# Patient Record
Sex: Female | Born: 1945 | Race: White | Hispanic: No | Marital: Married | Smoking: Never smoker
Health system: Southern US, Community
[De-identification: ages and names within clinical notes are randomized; demographics above are authoritative.]

## PROBLEM LIST (undated history)

## (undated) DIAGNOSIS — E785 Hyperlipidemia, unspecified: Secondary | ICD-10-CM

## (undated) DIAGNOSIS — K5792 Diverticulitis of intestine, part unspecified, without perforation or abscess without bleeding: Secondary | ICD-10-CM

## (undated) HISTORY — PX: TUBAL LIGATION: SHX77

## (undated) HISTORY — PX: TONSILLECTOMY: SUR1361

## (undated) HISTORY — PX: ABDOMINAL HYSTERECTOMY: SHX81

## (undated) HISTORY — PX: UVULOPLASTY: SHX6557

## (undated) HISTORY — DX: Diverticulitis of intestine, part unspecified, without perforation or abscess without bleeding: K57.92

## (undated) HISTORY — PX: APPENDECTOMY: SHX54

## (undated) HISTORY — DX: Hyperlipidemia, unspecified: E78.5

## (undated) HISTORY — PX: UPPER GI ENDOSCOPY: SHX6162

---

## 1985-11-14 HISTORY — PX: HYSTEROTOMY: SHX1776

## 2002-11-04 ENCOUNTER — Encounter: Payer: Self-pay | Admitting: Internal Medicine

## 2002-11-04 ENCOUNTER — Ambulatory Visit (HOSPITAL_COMMUNITY): Admission: RE | Admit: 2002-11-04 | Discharge: 2002-11-04 | Payer: Self-pay | Admitting: Internal Medicine

## 2010-01-26 ENCOUNTER — Ambulatory Visit: Payer: Self-pay | Admitting: Family Medicine

## 2012-08-08 LAB — LIPID PANEL
Cholesterol: 248 mg/dL — AB (ref 0–200)
HDL: 47 mg/dL (ref 35–70)
LDL Cholesterol: 148 mg/dL
LDl/HDL Ratio: 3.1
Triglycerides: 263 mg/dL — AB (ref 40–160)

## 2013-07-30 DIAGNOSIS — M461 Sacroiliitis, not elsewhere classified: Secondary | ICD-10-CM | POA: Insufficient documentation

## 2013-11-20 ENCOUNTER — Other Ambulatory Visit: Payer: Self-pay

## 2013-11-20 DIAGNOSIS — Z1231 Encounter for screening mammogram for malignant neoplasm of breast: Secondary | ICD-10-CM

## 2013-12-09 ENCOUNTER — Ambulatory Visit
Admission: RE | Admit: 2013-12-09 | Discharge: 2013-12-09 | Disposition: A | Discharge status: Home / Self Care | Payer: Self-pay | Source: Ambulatory Visit

## 2013-12-09 DIAGNOSIS — Z1231 Encounter for screening mammogram for malignant neoplasm of breast: Secondary | ICD-10-CM

## 2014-07-08 LAB — TSH: TSH: 2.02 u[IU]/mL (ref 0.41–5.90)

## 2014-07-08 LAB — HEPATIC FUNCTION PANEL
ALT: 17 U/L (ref 7–35)
AST: 15 U/L (ref 13–35)
Alkaline Phosphatase: 73 U/L (ref 25–125)
BILIRUBIN, TOTAL: 1 mg/dL

## 2014-07-08 LAB — CBC AND DIFFERENTIAL
HCT: 40 % (ref 36–46)
Hemoglobin: 13.5 g/dL (ref 12.0–16.0)
Platelets: 330 10*3/uL (ref 150–399)
WBC: 5.4 10^3/mL

## 2014-07-08 LAB — BASIC METABOLIC PANEL
BUN: 11 mg/dL (ref 4–21)
Creatinine: 0.7 mg/dL (ref 0.5–1.1)
Glucose: 112 mg/dL
Potassium: 4.2 mmol/L (ref 3.4–5.3)
Sodium: 143 mmol/L (ref 137–147)

## 2015-10-04 ENCOUNTER — Other Ambulatory Visit: Payer: Self-pay | Admitting: Family Medicine

## 2015-10-04 NOTE — Telephone Encounter (Signed)
Please call in clonazepam  

## 2015-10-15 ENCOUNTER — Other Ambulatory Visit: Payer: Self-pay | Admitting: Family Medicine

## 2015-10-16 NOTE — Telephone Encounter (Signed)
Rx called in to pharmacy. 

## 2015-10-16 NOTE — Telephone Encounter (Signed)
Please call in clonazepam  

## 2015-12-31 DIAGNOSIS — Z791 Long term (current) use of non-steroidal anti-inflammatories (NSAID): Secondary | ICD-10-CM | POA: Diagnosis not present

## 2015-12-31 DIAGNOSIS — M159 Polyosteoarthritis, unspecified: Secondary | ICD-10-CM | POA: Diagnosis not present

## 2015-12-31 DIAGNOSIS — M67362 Transient synovitis, left knee: Secondary | ICD-10-CM | POA: Diagnosis not present

## 2016-05-02 DIAGNOSIS — H5210 Myopia, unspecified eye: Secondary | ICD-10-CM | POA: Diagnosis not present

## 2016-05-06 DIAGNOSIS — G2581 Restless legs syndrome: Secondary | ICD-10-CM | POA: Insufficient documentation

## 2016-05-06 DIAGNOSIS — K219 Gastro-esophageal reflux disease without esophagitis: Secondary | ICD-10-CM | POA: Insufficient documentation

## 2016-05-06 DIAGNOSIS — J309 Allergic rhinitis, unspecified: Secondary | ICD-10-CM | POA: Insufficient documentation

## 2016-05-06 DIAGNOSIS — Z8739 Personal history of other diseases of the musculoskeletal system and connective tissue: Secondary | ICD-10-CM | POA: Insufficient documentation

## 2016-05-06 DIAGNOSIS — M1712 Unilateral primary osteoarthritis, left knee: Secondary | ICD-10-CM | POA: Insufficient documentation

## 2016-05-06 DIAGNOSIS — E785 Hyperlipidemia, unspecified: Secondary | ICD-10-CM | POA: Insufficient documentation

## 2016-05-06 DIAGNOSIS — K449 Diaphragmatic hernia without obstruction or gangrene: Secondary | ICD-10-CM | POA: Insufficient documentation

## 2016-05-06 DIAGNOSIS — K5790 Diverticulosis of intestine, part unspecified, without perforation or abscess without bleeding: Secondary | ICD-10-CM | POA: Insufficient documentation

## 2016-05-09 ENCOUNTER — Ambulatory Visit
Admission: RE | Admit: 2016-05-09 | Discharge: 2016-05-09 | Disposition: A | Discharge status: Home / Self Care | Payer: Medicare Other | Source: Ambulatory Visit | Attending: Family Medicine | Admitting: Family Medicine

## 2016-05-09 ENCOUNTER — Encounter: Payer: Self-pay | Admitting: Family Medicine

## 2016-05-09 ENCOUNTER — Ambulatory Visit (INDEPENDENT_AMBULATORY_CARE_PROVIDER_SITE_OTHER): Payer: Medicare Other | Admitting: Family Medicine

## 2016-05-09 VITALS — BP 102/80 | HR 79 | Temp 97.7°F | Resp 16 | Ht 63.0 in | Wt 199.0 lb

## 2016-05-09 DIAGNOSIS — M5432 Sciatica, left side: Secondary | ICD-10-CM | POA: Diagnosis not present

## 2016-05-09 DIAGNOSIS — M5116 Intervertebral disc disorders with radiculopathy, lumbar region: Secondary | ICD-10-CM | POA: Diagnosis not present

## 2016-05-09 DIAGNOSIS — M5136 Other intervertebral disc degeneration, lumbar region: Secondary | ICD-10-CM | POA: Diagnosis not present

## 2016-05-09 MED ORDER — PREDNISONE 10 MG PO TABS: ORAL_TABLET | ORAL | Status: DC

## 2016-05-09 MED ORDER — TRAMADOL HCL 50 MG PO TABS: ORAL_TABLET | ORAL | Status: DC

## 2016-05-09 NOTE — Patient Instructions (Addendum)
X-ray Lumbar spine Prednisone 12 day  Taper Tramadol 50 mg 1-2 tablets every 4 hours as needed Follow-up with Dr. Sherrie MustacheFisher sometime next week.

## 2016-05-09 NOTE — Progress Notes (Signed)
Patient: Chelsea NipperSandra L Tyler Female    DOB: January 27, 1946   70 y.o.   MRN: [id number] Visit Date: 05/09/2016  Today's Provider: Megan Mansichard Gilbert Jr, MD   Chief Complaint  Patient presents with  . Referral  . Hip Pain  . Leg Pain   Subjective:    Hip Pain  The incident occurred more than 1 week ago (over a year ago). There was no injury mechanism. The pain is present in the left leg and left hip. The quality of the pain is described as cramping and stabbing. The pain is at a severity of 5/10. The pain is moderate. The pain has been constant since onset. Associated symptoms include an inability to bear weight. Pertinent negatives include no loss of motion, loss of sensation, muscle weakness, numbness or tingling. She reports no foreign bodies present. The symptoms are aggravated by weight bearing (sitting). She has tried acetaminophen and NSAIDs for the symptoms. The treatment provided no relief.    Left buttock and back of leg pain for over a year. Pain is worse while sitting. Interrupts her sleep. Will stand when pain gets to bad. Has tried tylenol, aleve, Advil and meloxicam with no relief. Patient's rheumatologist recommended she see pcp for leg pain.    Allergies  Allergen Reactions  . Clarithromycin     Other reaction(s): Abdominal pain   Current Meds  Medication Sig  . clonazePAM (KLONOPIN) 0.5 MG tablet TAKE 1/2 TO 1 TABLET BY MOUTH ONCE A NIGHT AT BEDTIME  . meloxicam (MOBIC) 7.5 MG tablet Take by mouth.    Review of Systems  Constitutional: Negative for fever, chills, appetite change and fatigue.  Eyes: Negative.   Respiratory: Negative for chest tightness and shortness of breath.   Cardiovascular: Negative for chest pain and palpitations.  Gastrointestinal: Negative for nausea, vomiting and abdominal pain.  Endocrine: Negative.   Allergic/Immunologic: Negative.   Neurological: Negative for dizziness, tingling, weakness and numbness.       Straight leg positive on  the left. She has little bit of weakness with plantar flexion of the left foot.  Hematological: Negative.   Psychiatric/Behavioral: Negative.     Social History  Substance Use Topics  . Smoking status: Never Smoker   . Smokeless tobacco: Not on file  . Alcohol Use: No   Objective:   BP 102/80 mmHg  Pulse 79  Temp(Src) 97.7 F (36.5 C) (Oral)  Resp 16  Ht 5\' 3"  (1.6 m)  Wt 199 lb (90.266 kg)  BMI 35.26 kg/m2  SpO2 96%  Physical Exam  Constitutional: She is oriented to person, place, and time. She appears well-nourished.  Obese white female in no acute distress.  HENT:  Head: Normocephalic and atraumatic.  Right Ear: External ear normal.  Left Ear: External ear normal.  Nose: Nose normal.  Eyes: Conjunctivae are normal.  Neck: Neck supple.  Cardiovascular: Normal rate, regular rhythm and normal heart sounds.   Pulmonary/Chest: Effort normal and breath sounds normal.  Abdominal: Soft.  Neurological: She is alert and oriented to person, place, and time.  Skin: Skin is warm and dry.  Psychiatric: She has a normal mood and affect. Her behavior is normal. Judgment and thought content normal.        Assessment & Plan:     1. Sciatica of left side  - DG Lumbar Spine Complete; Future - predniSONE (DELTASONE) 10 MG tablet; Take by mouth 6 tablets for 2 days, then 5 tablets for 2 days,  then 4 tablets for 2 days, then 3 tablets for 2 days, then 2 tablets for 2 days, then 1 tablet for 2 days  Dispense: 42 tablet; Refill: 0 - traMADol (ULTRAM) 50 MG tablet; Take by mouth 1-2 tablets every 4 hours as needed  Dispense: 75 tablet; Refill: 1 I think patient will need MRI on next visit. Hopefully steroids will calm things down. Return to clinic 1-2 weeks. 2. Chronic osteoarthritis      Megan Mansichard Gilbert Jr, MD  [facility]

## 2016-05-10 ENCOUNTER — Other Ambulatory Visit: Payer: Self-pay | Admitting: Family Medicine

## 2016-05-18 NOTE — Telephone Encounter (Signed)
Rx called in to pharmacy. 

## 2016-05-18 NOTE — Telephone Encounter (Signed)
Please call in clonazepam  

## 2016-06-02 ENCOUNTER — Ambulatory Visit (INDEPENDENT_AMBULATORY_CARE_PROVIDER_SITE_OTHER): Payer: Medicare Other | Admitting: Family Medicine

## 2016-06-02 ENCOUNTER — Encounter: Payer: Self-pay | Admitting: Family Medicine

## 2016-06-02 VITALS — BP 100/60 | HR 88 | Temp 97.4°F | Resp 16 | Ht 63.0 in | Wt 200.0 lb

## 2016-06-02 DIAGNOSIS — M5432 Sciatica, left side: Secondary | ICD-10-CM | POA: Diagnosis not present

## 2016-06-02 MED ORDER — PREDNISONE 10 MG PO TABS: ORAL_TABLET | ORAL | Status: AC

## 2016-06-02 NOTE — Progress Notes (Signed)
       Patient: Chelsea NipperSandra L Grupp Female    DOB: 11-27-1945   70 y.o.   MRN: [id number] Visit Date: 06/02/2016  Today's Provider: Mila Merryonald Topaz Raglin, MD   Chief Complaint  Patient presents with  . Follow-up  . Sciatica   Subjective:    HPI  Sciatica of left side: From 05/09/2016- seen by Dr. Sullivan LoneGilbert for left back pain radiating into left leg. Was treated with prednisone and tramadol is feels it is about 80% better. Pain had completely resolved after being on prednisone a couple of days, but has started to flare back up since finishing.   Lumbar Spine Complete; showed Athritis and DDD.    Allergies  Allergen Reactions  . Clarithromycin     Other reaction(s): Abdominal pain   Current Meds  Medication Sig  . clonazePAM (KLONOPIN) 0.5 MG tablet TAKE 1/2 TO 1 TABLET BY MOUTH EVERY NIGHT AT BEDTIME  . meloxicam (MOBIC) 7.5 MG tablet Take by mouth.  . traMADol (ULTRAM) 50 MG tablet Take by mouth 1-2 tablets every 4 hours as needed    Review of Systems  Constitutional: Negative for fever, chills, appetite change and fatigue.  Respiratory: Negative for chest tightness and shortness of breath.   Cardiovascular: Negative for chest pain and palpitations.  Gastrointestinal: Negative for nausea, vomiting and abdominal pain.  Neurological: Negative for dizziness and weakness.    Social History  Substance Use Topics  . Smoking status: Never Smoker   . Smokeless tobacco: Not on file  . Alcohol Use: No   Objective:   BP 100/60 mmHg  Pulse 88  Temp(Src) 97.4 F (36.3 C) (Oral)  Resp 16  Ht 5\' 3"  (1.6 m)  Wt 200 lb (90.719 kg)  BMI 35.44 kg/m2  SpO2 96%  Physical Exam  General appearance: alert, well developed, well nourished, cooperative and in no distress Head: Normocephalic, without obvious abnormality, atraumatic Respiratory: Respirations even and unlabored, normal respiratory rate Extremities: Mild tenderness lumbar spine. Tenderness of left buttocks which reproduces pain  radiating into back of left leg.  Neurologic: Mental status: Alert, oriented to person, place, and time, thought content appropriate.     Assessment & Plan:     1. Sciatica of left side Responded well to prednisone, but has since started to flare back up. Start back on slow longer taper and recommended using seat cushion when sitting for long periods. If symptoms return again will consider PT referral.  - predniSONE (DELTASONE) 10 MG tablet; 4 for 3 days, then 3 for 3 days, then 2 for 3 days, then 1 daily  Dispense: 50 tablet; Refill: 0       Mila Merryonald Landon Bassford, MD  [facility]

## 2016-09-01 DIAGNOSIS — Z23 Encounter for immunization: Secondary | ICD-10-CM | POA: Diagnosis not present

## 2016-09-20 ENCOUNTER — Other Ambulatory Visit: Payer: Self-pay | Admitting: Family Medicine

## 2016-09-20 DIAGNOSIS — M5432 Sciatica, left side: Secondary | ICD-10-CM

## 2016-10-04 DIAGNOSIS — Z6835 Body mass index (BMI) 35.0-35.9, adult: Secondary | ICD-10-CM | POA: Diagnosis not present

## 2016-10-04 DIAGNOSIS — Z1231 Encounter for screening mammogram for malignant neoplasm of breast: Secondary | ICD-10-CM | POA: Diagnosis not present

## 2016-10-04 DIAGNOSIS — Z01419 Encounter for gynecological examination (general) (routine) without abnormal findings: Secondary | ICD-10-CM | POA: Diagnosis not present

## 2016-12-06 DIAGNOSIS — M461 Sacroiliitis, not elsewhere classified: Secondary | ICD-10-CM | POA: Diagnosis not present

## 2016-12-06 DIAGNOSIS — M67362 Transient synovitis, left knee: Secondary | ICD-10-CM | POA: Diagnosis not present

## 2016-12-06 DIAGNOSIS — M159 Polyosteoarthritis, unspecified: Secondary | ICD-10-CM | POA: Diagnosis not present

## 2016-12-06 DIAGNOSIS — Z791 Long term (current) use of non-steroidal anti-inflammatories (NSAID): Secondary | ICD-10-CM | POA: Diagnosis not present

## 2016-12-21 ENCOUNTER — Other Ambulatory Visit: Payer: Self-pay | Admitting: Family Medicine

## 2016-12-21 NOTE — Telephone Encounter (Signed)
Please call in clonazepam  

## 2016-12-22 NOTE — Telephone Encounter (Signed)
Called in. Emily Drozdowski, CMA  

## 2017-03-02 DIAGNOSIS — H25013 Cortical age-related cataract, bilateral: Secondary | ICD-10-CM | POA: Diagnosis not present

## 2017-03-07 DIAGNOSIS — M159 Polyosteoarthritis, unspecified: Secondary | ICD-10-CM | POA: Diagnosis not present

## 2017-03-07 DIAGNOSIS — M67312 Transient synovitis, left shoulder: Secondary | ICD-10-CM | POA: Diagnosis not present

## 2017-03-07 DIAGNOSIS — Z79899 Other long term (current) drug therapy: Secondary | ICD-10-CM | POA: Diagnosis not present

## 2017-03-07 DIAGNOSIS — M67362 Transient synovitis, left knee: Secondary | ICD-10-CM | POA: Diagnosis not present

## 2017-03-10 DIAGNOSIS — H02839 Dermatochalasis of unspecified eye, unspecified eyelid: Secondary | ICD-10-CM | POA: Diagnosis not present

## 2017-03-10 DIAGNOSIS — H18413 Arcus senilis, bilateral: Secondary | ICD-10-CM | POA: Diagnosis not present

## 2017-03-10 DIAGNOSIS — H2513 Age-related nuclear cataract, bilateral: Secondary | ICD-10-CM | POA: Diagnosis not present

## 2017-04-13 DIAGNOSIS — H2511 Age-related nuclear cataract, right eye: Secondary | ICD-10-CM | POA: Diagnosis not present

## 2017-05-01 DIAGNOSIS — H2511 Age-related nuclear cataract, right eye: Secondary | ICD-10-CM | POA: Diagnosis not present

## 2017-05-01 DIAGNOSIS — H2512 Age-related nuclear cataract, left eye: Secondary | ICD-10-CM | POA: Diagnosis not present

## 2017-05-02 DIAGNOSIS — H2512 Age-related nuclear cataract, left eye: Secondary | ICD-10-CM | POA: Diagnosis not present

## 2017-05-03 DIAGNOSIS — Z791 Long term (current) use of non-steroidal anti-inflammatories (NSAID): Secondary | ICD-10-CM | POA: Diagnosis not present

## 2017-05-03 DIAGNOSIS — M159 Polyosteoarthritis, unspecified: Secondary | ICD-10-CM | POA: Diagnosis not present

## 2017-05-22 DIAGNOSIS — H2512 Age-related nuclear cataract, left eye: Secondary | ICD-10-CM | POA: Diagnosis not present

## 2017-06-05 ENCOUNTER — Other Ambulatory Visit: Payer: Self-pay | Admitting: Family Medicine

## 2017-06-06 NOTE — Telephone Encounter (Signed)
RX called in at [facility]. Left message for patient to call back to schedule a follow up appointment.

## 2017-06-06 NOTE — Telephone Encounter (Signed)
OK to call in 30 clonazepam with no refills, but patient has not been seen In over a year and needs to schedule follow up appointment with me or with Dr. Leonard SchwartzB.

## 2017-07-25 ENCOUNTER — Encounter: Payer: Self-pay | Admitting: Family Medicine

## 2017-07-25 ENCOUNTER — Ambulatory Visit (INDEPENDENT_AMBULATORY_CARE_PROVIDER_SITE_OTHER): Payer: Medicare Other | Admitting: Family Medicine

## 2017-07-25 ENCOUNTER — Ambulatory Visit: Payer: Medicare Other | Admitting: Family Medicine

## 2017-07-25 VITALS — BP 120/80 | HR 80 | Temp 98.3°F | Resp 16 | Ht 63.0 in | Wt 212.0 lb

## 2017-07-25 DIAGNOSIS — Z1211 Encounter for screening for malignant neoplasm of colon: Secondary | ICD-10-CM

## 2017-07-25 DIAGNOSIS — Z23 Encounter for immunization: Secondary | ICD-10-CM

## 2017-07-25 DIAGNOSIS — E785 Hyperlipidemia, unspecified: Secondary | ICD-10-CM | POA: Diagnosis not present

## 2017-07-25 DIAGNOSIS — G2581 Restless legs syndrome: Secondary | ICD-10-CM | POA: Diagnosis not present

## 2017-07-25 DIAGNOSIS — R609 Edema, unspecified: Secondary | ICD-10-CM

## 2017-07-25 DIAGNOSIS — Z6837 Body mass index (BMI) 37.0-37.9, adult: Secondary | ICD-10-CM

## 2017-07-25 MED ORDER — CLONAZEPAM 0.5 MG PO TABS: ORAL_TABLET | ORAL | 5 refills | Status: DC

## 2017-07-25 NOTE — Progress Notes (Signed)
       Patient: Chelsea NipperSandra L Tyler Female    DOB: 19-Nov-1945   71 y.o.   MRN: [id number] Visit Date: 07/25/2017  Today's Provider: Mila Merryonald Fisher, MD   Chief Complaint  Patient presents with  . Follow-up   Subjective:    HPI Follow up of Restless leg:  Patient was last seen for this problem 3 years ago. Management during that visit includes starting Clonazepam. Patient reports good complaince with treatment, good tolerance and good symptom control. Patient states that she ran out of her medication 4 days ago and has been having trouble sleeping.     Allergies  Allergen Reactions  . Clarithromycin     Other reaction(s): Abdominal pain     Current Outpatient Prescriptions:  .  clonazePAM (KLONOPIN) 0.5 MG tablet, TAKE 1/2 TO 1 TABLET BY MOUTH EVERY NIGHT AT BEDTIME, Disp: 30 tablet, Rfl: 0 .  meloxicam (MOBIC) 7.5 MG tablet, Take by mouth., Disp: , Rfl:  .  traMADol (ULTRAM) 50 MG tablet, Take by mouth 1-2 tablets every 4 hours as needed, Disp: 75 tablet, Rfl: 1  Review of Systems  Constitutional: Negative for appetite change, chills, fatigue and fever.  Respiratory: Negative for chest tightness and shortness of breath.   Cardiovascular: Positive for leg swelling (swelling in both ankles). Negative for chest pain and palpitations.  Gastrointestinal: Negative for abdominal pain, nausea and vomiting.  Neurological: Negative for dizziness and weakness.    Social History  Substance Use Topics  . Smoking status: Never Smoker  . Smokeless tobacco: Never Used  . Alcohol use No   Objective:   BP 120/80 (BP Location: Left Arm, Patient Position: Sitting, Cuff Size: Large)   Pulse 80   Temp 98.3 F (36.8 C) (Oral)   Resp 16   Ht 5\' 3"  (1.6 m)   Wt 212 lb (96.2 kg)   SpO2 97% Comment: room air  BMI 37.55 kg/m     Physical Exam   General Appearance:    Alert, cooperative, no distress  Eyes:    PERRL, conjunctiva/corneas clear, EOM's intact       Lungs:     Clear to  auscultation bilaterally, respirations unlabored  Heart:    Regular rate and rhythm  Neurologic:   Awake, alert, oriented x 3. No apparent focal neurological           defect.           Assessment & Plan:     1. Restless leg Well controlled with clonazepam qhs - clonazePAM (KLONOPIN) 0.5 MG tablet; TAKE 1/2 TO 1 TABLET BY MOUTH EVERY NIGHT AT BEDTIME  Dispense: 30 tablet; Refill: 5  2. Hyperlipidemia, unspecified hyperlipidemia type diet controlled.  - Lipid panel  3. Edema, unspecified type Counseled to avoid high sodium foods.  - Comprehensive metabolic panel - CBC  4. Need for pneumococcal vaccination - Pneumococcal conjugate vaccine 13-valent IM  5. Need for influenza vaccination - Flu vaccine HIGH DOSE PF (Fluzone High dose)  6. BMI 37.0-37.9, adult Counseled healthy diet.   . Colon cancer screening  - Cologuard       Mila Merryonald Fisher, MD  [facility]

## 2017-07-26 ENCOUNTER — Telehealth: Payer: Self-pay | Admitting: Family Medicine

## 2017-07-26 NOTE — Telephone Encounter (Signed)
Order for cologuard faxed to [facility] °

## 2017-08-08 DIAGNOSIS — Z1212 Encounter for screening for malignant neoplasm of rectum: Secondary | ICD-10-CM | POA: Diagnosis not present

## 2017-08-08 DIAGNOSIS — E785 Hyperlipidemia, unspecified: Secondary | ICD-10-CM | POA: Diagnosis not present

## 2017-08-08 DIAGNOSIS — Z1211 Encounter for screening for malignant neoplasm of colon: Secondary | ICD-10-CM | POA: Diagnosis not present

## 2017-08-08 DIAGNOSIS — R609 Edema, unspecified: Secondary | ICD-10-CM | POA: Diagnosis not present

## 2017-08-08 LAB — COMPREHENSIVE METABOLIC PANEL
AG RATIO: 1.8 (calc) (ref 1.0–2.5)
ALT: 22 U/L (ref 6–29)
AST: 19 U/L (ref 10–35)
Albumin: 4.1 g/dL (ref 3.6–5.1)
Alkaline phosphatase (APISO): 64 U/L (ref 33–130)
BUN: 9 mg/dL (ref 7–25)
CO2: 28 mmol/L (ref 20–32)
Calcium: 9.1 mg/dL (ref 8.6–10.4)
Chloride: 105 mmol/L (ref 98–110)
Creat: 0.66 mg/dL (ref 0.50–0.99)
GLOBULIN: 2.3 g/dL (ref 1.9–3.7)
GLUCOSE: 107 mg/dL — AB (ref 65–99)
Potassium: 4.7 mmol/L (ref 3.5–5.3)
SODIUM: 140 mmol/L (ref 135–146)
TOTAL PROTEIN: 6.4 g/dL (ref 6.1–8.1)
Total Bilirubin: 1.4 mg/dL — ABNORMAL HIGH (ref 0.2–1.2)

## 2017-08-08 LAB — CBC
HEMATOCRIT: 40 % (ref 35.0–45.0)
Hemoglobin: 13.7 g/dL (ref 11.7–15.5)
MCH: 29.5 pg (ref 27.0–33.0)
MCHC: 34.3 g/dL (ref 32.0–36.0)
MCV: 86 fL (ref 80.0–100.0)
MPV: 10.9 fL (ref 7.5–12.5)
PLATELETS: 347 10*3/uL (ref 140–400)
RBC: 4.65 10*6/uL (ref 3.80–5.10)
RDW: 13.8 % (ref 11.0–15.0)
WBC: 5.6 10*3/uL (ref 3.8–10.8)

## 2017-08-08 LAB — LIPID PANEL
Cholesterol: 236 mg/dL — ABNORMAL HIGH (ref <200)
HDL: 52 mg/dL (ref >50)
LDL Cholesterol (Calc): 151 mg/dL (calc) — ABNORMAL HIGH
Non-HDL Cholesterol (Calc): 184 mg/dL (calc) — ABNORMAL HIGH (ref <130)
Total CHOL/HDL Ratio: 4.5 (calc) (ref <5.0)
Triglycerides: 190 mg/dL — ABNORMAL HIGH (ref <150)

## 2017-08-09 ENCOUNTER — Telehealth: Payer: Self-pay

## 2017-08-09 LAB — COLOGUARD: COLOGUARD: POSITIVE

## 2017-08-09 NOTE — Telephone Encounter (Signed)
Patient advised.KW 

## 2017-08-09 NOTE — Telephone Encounter (Signed)
-----   Message from Malva Limes, MD sent at 08/09/2017  7:39 AM EDT ----- Cholesterol a little better, down from 248 to 236. Rest of labs normal. Avoid saturated fats in diet and check lipids one year.

## 2017-08-15 ENCOUNTER — Telehealth: Payer: Self-pay | Admitting: *Deleted

## 2017-08-15 DIAGNOSIS — R195 Other fecal abnormalities: Secondary | ICD-10-CM

## 2017-08-15 NOTE — Telephone Encounter (Signed)
Patient was notified of results. Patient wants [place] to wait before starting GI referral. She is deciding who she wants to be referred to. She will call us back in a couple of days.

## 2017-08-15 NOTE — Telephone Encounter (Signed)
-----   Message from Malva Limes, MD sent at 08/14/2017 12:23 PM EDT ----- Cologuard test is positive, she needs referral for colonoscopy for further evaluation.

## 2017-08-16 NOTE — Telephone Encounter (Signed)
Patient cb stating that she has decided to go ahead with referral for GI. Patient wants referral with [facility].

## 2017-08-16 NOTE — Telephone Encounter (Signed)
Please schedule referral to [facility]. Thanks!

## 2017-09-05 DIAGNOSIS — Z800 Family history of malignant neoplasm of digestive organs: Secondary | ICD-10-CM | POA: Diagnosis not present

## 2017-09-05 DIAGNOSIS — Z86010 Personal history of colonic polyps: Secondary | ICD-10-CM | POA: Diagnosis not present

## 2017-09-05 DIAGNOSIS — K219 Gastro-esophageal reflux disease without esophagitis: Secondary | ICD-10-CM | POA: Diagnosis not present

## 2017-09-05 DIAGNOSIS — R195 Other fecal abnormalities: Secondary | ICD-10-CM | POA: Diagnosis not present

## 2017-09-14 DIAGNOSIS — K222 Esophageal obstruction: Secondary | ICD-10-CM | POA: Diagnosis not present

## 2017-09-14 DIAGNOSIS — R195 Other fecal abnormalities: Secondary | ICD-10-CM | POA: Diagnosis not present

## 2017-09-14 DIAGNOSIS — K210 Gastro-esophageal reflux disease with esophagitis: Secondary | ICD-10-CM | POA: Diagnosis not present

## 2017-09-14 DIAGNOSIS — K5730 Diverticulosis of large intestine without perforation or abscess without bleeding: Secondary | ICD-10-CM | POA: Diagnosis not present

## 2017-09-14 DIAGNOSIS — K648 Other hemorrhoids: Secondary | ICD-10-CM | POA: Diagnosis not present

## 2017-09-14 DIAGNOSIS — Z86010 Personal history of colonic polyps: Secondary | ICD-10-CM | POA: Diagnosis not present

## 2017-09-14 LAB — HM COLONOSCOPY

## 2018-01-23 ENCOUNTER — Encounter: Payer: Self-pay | Admitting: Family Medicine

## 2018-01-23 ENCOUNTER — Ambulatory Visit (INDEPENDENT_AMBULATORY_CARE_PROVIDER_SITE_OTHER): Payer: Medicare Other | Admitting: Family Medicine

## 2018-01-23 VITALS — BP 122/84 | HR 77 | Temp 97.5°F | Resp 16 | Ht 63.0 in | Wt 206.0 lb

## 2018-01-23 DIAGNOSIS — R7303 Prediabetes: Secondary | ICD-10-CM | POA: Insufficient documentation

## 2018-01-23 DIAGNOSIS — Z780 Asymptomatic menopausal state: Secondary | ICD-10-CM

## 2018-01-23 DIAGNOSIS — Z1159 Encounter for screening for other viral diseases: Secondary | ICD-10-CM | POA: Diagnosis not present

## 2018-01-23 DIAGNOSIS — R739 Hyperglycemia, unspecified: Secondary | ICD-10-CM | POA: Diagnosis not present

## 2018-01-23 DIAGNOSIS — Z0000 Encounter for general adult medical examination without abnormal findings: Secondary | ICD-10-CM

## 2018-01-23 DIAGNOSIS — Z1239 Encounter for other screening for malignant neoplasm of breast: Secondary | ICD-10-CM

## 2018-01-23 DIAGNOSIS — Z1231 Encounter for screening mammogram for malignant neoplasm of breast: Secondary | ICD-10-CM | POA: Diagnosis not present

## 2018-01-23 NOTE — Progress Notes (Deleted)
Patient: Chelsea Tyler, Female    DOB: 03/14/1946, 72 y.o.   MRN: [id number] Visit Date: 01/23/2018  Today's Provider: Shirlee LatchAngela Bacigalupo, MD   I, Joslyn HyEmily Aira Sallade, CMA, am acting as scribe for Shirlee LatchAngela Bacigalupo, MD.  No chief complaint on file.  Subjective:    Annual physical exam Chelsea NipperSandra L Kunkle is a 72 y.o. female who presents today for health maintenance and complete physical. She feels {DESC; WELL/FAIRLY WELL/POORLY:18703}. She reports exercising ***. She reports she is sleeping {DESC; WELL/FAIRLY WELL/POORLY:18703}.  Last mammogram- 12/09/2013- BI-RADS 1 -----------------------------------------------------------------   Review of Systems  Social History      She  reports that  has never smoked. she has never used smokeless tobacco. She reports that she does not drink alcohol or use drugs.       Social History   Socioeconomic History  . Marital status: Married    Spouse name: Not on file  . Number of children: Not on file  . Years of education: Not on file  . Highest education level: Not on file  Social Needs  . Financial resource strain: Not on file  . Food insecurity - worry: Not on file  . Food insecurity - inability: Not on file  . Transportation needs - medical: Not on file  . Transportation needs - non-medical: Not on file  Occupational History  . Not on file  Tobacco Use  . Smoking status: Never Smoker  . Smokeless tobacco: Never Used  Substance and Sexual Activity  . Alcohol use: No    Alcohol/week: 0.0 oz  . Drug use: No  . Sexual activity: Not on file  Other Topics Concern  . Not on file  Social History Narrative  . Not on file    No past medical history on file.   Patient Active Problem List   Diagnosis Date Noted  . Allergic rhinitis 05/06/2016  . DD (diverticular disease) 05/06/2016  . Acid reflux 05/06/2016  . 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 Personal history of other diseases of the musculoskeletal system and connective  tissue 05/06/2016  . HLD (hyperlipidemia) 05/06/2016  . Arthritis, degenerative 05/06/2016  . Restless leg 05/06/2016    Past Surgical History:  Procedure Laterality Date  . ABDOMINAL HYSTERECTOMY    . APPENDECTOMY    . HYSTEROTOMY  1987  . TONSILLECTOMY    . TUBAL LIGATION    . UPPER GI ENDOSCOPY     10/15/02  . UVULOPLASTY      Family History        Family Status  Relation Name Status  . Father  Deceased at age 70  . Daughter  Alive  . Son  Deceased       MVA  . Daughter  Alive  . Mother  (Not Specified)        Her family history includes Arthritis in her mother; Breast cancer in her mother; Cancer in her father; Heart disease in her mother.      Allergies  Allergen Reactions  . Clarithromycin     Other reaction(s): Abdominal pain     Current Outpatient Medications:  .  clonazePAM (KLONOPIN) 0.5 MG tablet, TAKE 1/2 TO 1 TABLET BY MOUTH EVERY NIGHT AT BEDTIME, Disp: 30 tablet, Rfl: 5 .  meloxicam (MOBIC) 7.5 MG tablet, Take by mouth., Disp: , Rfl:  .  traMADol (ULTRAM) 50 MG tablet, Take by mouth 1-2 tablets every 4 hours as needed, Disp: 75 tablet, Rfl: 1   Patient Care  Team: Malva Limes, MD as PCP - General (Family Medicine)      Objective:   Vitals: There were no vitals taken for this visit.  There were no vitals filed for this visit.   Physical Exam   Depression Screen PHQ 2/9 Scores 07/25/2017  PHQ - 2 Score 0  PHQ- 9 Score 0      Assessment & Plan:     Routine Health Maintenance and Physical Exam  Exercise Activities and Dietary recommendations Goals    None      Immunization History  Administered Date(s) Administered  . Influenza, High Dose Seasonal PF 07/25/2017  . Pneumococcal Conjugate-13 07/25/2017  . Pneumococcal Polysaccharide-23 08/22/2013  . Td 12/17/1999  . Zoster 08/06/2012    Health Maintenance  Topic Date Due  . Hepatitis C Screening  [birth year]  . COLONOSCOPY  02/08/1998  . TETANUS/TDAP  12/16/2009  .  DEXA SCAN  02/08/2013  . MAMMOGRAM  12/10/2015  . INFLUENZA VACCINE  Completed  . PNA vac Low Risk Adult  Completed     Discussed health benefits of physical activity, and encouraged her to engage in regular exercise appropriate for her age and condition.    --------------------------------------------------------------------

## 2018-01-23 NOTE — Progress Notes (Signed)
Patient: Chelsea Tyler, Female    DOB: 12/25/1945, 72 y.o.   MRN: [id number] Visit Date: 01/23/2018  Today's Provider: Shirlee LatchAngela Boen Sterbenz, MD   Chief Complaint  Patient presents with  . Medicare Wellness   Subjective:    Annual wellness visit Chelsea NipperSandra L Tyler is a 72 y.o. female. She feels well. She is c/o bilateral leg swelling. She reports exercising none due to arthritic pain. She reports she is sleeping poorly.  Last mammogram- 12/09/2013- BI-RADS 1 in our records; pt states [facility]. Last colonoscopy- 09/14/2017- at [facility] through Dr. Mechele CollinElliott. Will request results. Pt states this was WNL. -----------------------------------------------------------   Review of Systems  Constitutional: Negative.   HENT: Negative.   Eyes: Negative.   Respiratory: Negative.   Cardiovascular: Positive for leg swelling. Negative for chest pain and palpitations.  Gastrointestinal: Positive for constipation. Negative for abdominal distention, abdominal pain, anal bleeding, blood in stool, diarrhea, nausea, rectal pain and vomiting.  Endocrine: Negative.   Genitourinary: Negative.   Musculoskeletal: Positive for arthralgias and joint swelling. Negative for back pain, gait problem, myalgias, neck pain and neck stiffness.  Skin: Negative.   Allergic/Immunologic: Negative.   Neurological: Negative.   Hematological: Negative.   Psychiatric/Behavioral: Negative.     Social History   Socioeconomic History  . Marital status: Married    Spouse name: Chelsea Tyler  . Number of children: 3  . Years of education: Not on file  . Highest education level: GED or equivalent  Social Needs  . Financial resource strain: Not hard at all  . Food insecurity - worry: Never true  . Food insecurity - inability: Never true  . Transportation needs - medical: No  . Transportation needs - non-medical: No  Occupational History  . Occupation: retired  Tobacco  Use  . Smoking status: Never Smoker  . Smokeless tobacco: Never Used  Substance and Sexual Activity  . Alcohol use: No    Alcohol/week: 0.0 oz  . Drug use: No  . Sexual activity: Not on file  Other Topics Concern  . Not on file  Social History Narrative   Pt's son was killed by a drunk driver.    History reviewed. No pertinent past medical history.   Patient Active Problem List   Diagnosis Date Noted  . Hyperglycemia 01/23/2018  . Allergic rhinitis 05/06/2016  . DD (diverticular disease) 05/06/2016  . Acid reflux 05/06/2016  . 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 Personal history of other diseases of the musculoskeletal system and connective tissue 05/06/2016  . HLD (hyperlipidemia) 05/06/2016  . Arthritis, degenerative 05/06/2016  . Restless leg 05/06/2016    Past Surgical History:  Procedure Laterality Date  . ABDOMINAL HYSTERECTOMY    . APPENDECTOMY    . HYSTEROTOMY  1987  . TONSILLECTOMY    . TUBAL LIGATION    . UPPER GI ENDOSCOPY     10/15/02  . UVULOPLASTY      Her family history includes Arthritis in her mother; Breast cancer in her mother; Healthy in her daughter, daughter, sister, and sister; Heart disease in her mother; Ovarian cancer in her mother; Stomach cancer in her father.      Current Outpatient Medications:  .  clonazePAM (KLONOPIN) 0.5 MG tablet, TAKE 1/2 TO 1 TABLET BY MOUTH EVERY NIGHT AT BEDTIME, Disp: 30 tablet, Rfl: 5 .  meloxicam (MOBIC) 7.5 MG tablet, Take by mouth., Disp: , Rfl:  .  ranitidine (ZANTAC) 150 MG  tablet, Take 150 mg by mouth 2 (two) times daily., Disp: , Rfl:  .  traMADol (ULTRAM) 50 MG tablet, Take by mouth 1-2 tablets every 4 hours as needed, Disp: 75 tablet, Rfl: 1  Patient Care Team: Malva Limes, MD as PCP - General (Family Medicine)     Objective:   Vitals: BP 122/84 (BP Location: Left Arm, Patient Position: Sitting, Cuff Size: Large)   Pulse 77   Temp (!) 97.5 F (36.4 C) (Oral)   Resp 16   Ht 5\' 3"  (1.6 m)    Wt 206 lb (93.4 kg)   SpO2 97%   BMI 36.49 kg/m   Physical Exam  Constitutional: She is oriented to person, place, and time. She appears well-developed and well-nourished. No distress.  HENT:  Head: Normocephalic and atraumatic.  Right Ear: External ear normal.  Left Ear: External ear normal.  Nose: Nose normal.  Mouth/Throat: Oropharynx is clear and moist.  Eyes: Conjunctivae and EOM are normal. Pupils are equal, round, and reactive to light. No scleral icterus.  Neck: Neck supple. No thyromegaly present.  Cardiovascular: Normal rate, regular rhythm, normal heart sounds and intact distal pulses.  No murmur heard. Pulmonary/Chest: Effort normal and breath sounds normal. No respiratory distress. She has no wheezes. She has no rales.  Abdominal: Soft. Bowel sounds are normal. She exhibits no distension. There is no tenderness. There is no rebound and no guarding.  Genitourinary:  Genitourinary Comments: Breasts: breasts appear normal, no suspicious masses, no skin or nipple changes or axillary nodes.  Musculoskeletal: She exhibits no edema or deformity.  Lymphadenopathy:    She has no cervical adenopathy.  Neurological: She is alert and oriented to person, place, and time.  Skin: Skin is warm and dry. No rash noted.  Psychiatric: She has a normal mood and affect. Her behavior is normal.  Vitals reviewed.   Activities of Daily Living In your present state of health, do you have any difficulty performing the following activities: 01/23/2018  Hearing? N  Vision? N  Difficulty concentrating or making decisions? N  Walking or climbing stairs? Y  Dressing or bathing? N  Doing errands, shopping? N  Some recent data might be hidden    Fall Risk Assessment Fall Risk  01/23/2018 07/25/2017  Falls in the past year? No No     Depression Screen PHQ 2/9 Scores 01/23/2018 07/25/2017  PHQ - 2 Score 0 0  PHQ- 9 Score - 0    Cognitive Testing - 6-CIT  Correct? Score   What year is it?  yes 0 0 or 4  What month is it? yes 0 0 or 3  Memorize:    Chelsea Tyler,  44,  High [address]  [place],      What time is it? (within 1 hour) yes 0 0 or 3  Count backwards from 20 yes 0 0, 2, or 4  Name the months of the year yes 0 0, 2, or 4  Repeat name & address above yes 0 0, 2, 4, 6, 8, or 10       TOTAL SCORE  0/28   Interpretation:  Normal  Normal (0-7) Abnormal (8-28)    Audit-C Alcohol Use Screening  Question Answer Points  How often do you have alcoholic drink? never 0  On days you do drink alcohol, how many drinks do you typically consume? 0 0  How oftey will you drink 6 or more in a total? never 0  Total Score:  0  A score of 3 or more in women, and 4 or more in men indicates increased risk for alcohol abuse, EXCEPT if all of the points are from question 1.     Assessment & Plan:     Annual Wellness Visit  Reviewed patient's Family Medical History Reviewed and updated list of patient's medical providers Assessment of cognitive impairment was done Assessed patient's functional ability Established a written schedule for health screening services Health Risk Assessent Completed and Reviewed  Exercise Activities and Dietary recommendations Goals    None      Immunization History  Administered Date(s) Administered  . Influenza, High Dose Seasonal PF 07/25/2017  . Pneumococcal Conjugate-13 07/25/2017  . Pneumococcal Polysaccharide-23 08/22/2013  . Td 12/17/1999  . Zoster 08/06/2012    Health Maintenance  Topic Date Due  . Hepatitis C Screening  24-Apr-1946  . COLONOSCOPY  02/08/1998  . TETANUS/TDAP  12/16/2009  . DEXA SCAN  02/08/2013  . MAMMOGRAM  12/10/2015  . INFLUENZA VACCINE  Completed  . PNA vac Low Risk Adult  Completed     Discussed health benefits of physical activity, and encouraged her to engage in regular exercise appropriate for her age and condition.      ------------------------------------------------------------------------------------------------------------ Problem List Items Addressed This Visit      Other   Hyperglycemia   Relevant Orders   Hemoglobin A1c    Other Visit Diagnoses    Medicare annual wellness visit, subsequent    -  Primary   Need for hepatitis C screening test       Relevant Orders   Hepatitis C Antibody   Postmenopausal       Relevant Orders   DG Bone Density   Screening for breast cancer       Relevant Orders   MM SCREENING BREAST TOMO BILATERAL       Return in about 6 months (around 07/26/2018) for PCP f/u chronic disease.   The entirety of the information documented in the History of Present Illness, Review of Systems and Physical Exam were personally obtained by me. Portions of this information were initially documented by Irving Burton Ratchford, CMA and reviewed by me for thoroughness and accuracy.    Erasmo Downer, MD, MPH [facility] 01/23/2018 10:52 AM

## 2018-01-23 NOTE — Patient Instructions (Signed)
Preventive Care 65 Years and Older, Female Preventive care refers to lifestyle choices and visits with your health care provider that can promote health and wellness. What does preventive care include?  A yearly physical exam. This is also called an annual well check.  Dental exams once or twice a year.  Routine eye exams. Ask your health care provider how often you should have your eyes checked.  Personal lifestyle choices, including: ? Daily care of your teeth and gums. ? Regular physical activity. ? Eating a healthy diet. ? Avoiding tobacco and drug use. ? Limiting alcohol use. ? Practicing safe sex. ? Taking low-dose aspirin every day. ? Taking vitamin and mineral supplements as recommended by your health care provider. What happens during an annual well check? The services and screenings done by your health care provider during your annual well check will depend on your age, overall health, lifestyle risk factors, and family history of disease. Counseling Your health care provider may ask you questions about your:  Alcohol use.  Tobacco use.  Drug use.  Emotional well-being.  Home and relationship well-being.  Sexual activity.  Eating habits.  History of falls.  Memory and ability to understand (cognition).  Work and work environment.  Reproductive health.  Screening You may have the following tests or measurements:  Height, weight, and BMI.  Blood pressure.  Lipid and cholesterol levels. These may be checked every 5 years, or more frequently if you are over 50 years old.  Skin check.  Lung cancer screening. You may have this screening every year starting at age 55 if you have a 30-pack-year history of smoking and currently smoke or have quit within the past 15 years.  Fecal occult blood test (FOBT) of the stool. You may have this test every year starting at age 50.  Flexible sigmoidoscopy or colonoscopy. You may have a sigmoidoscopy every 5 years or  a colonoscopy every 10 years starting at age 50.  Hepatitis C blood test.  Hepatitis B blood test.  Sexually transmitted disease (STD) testing.  Diabetes screening. This is done by checking your blood sugar (glucose) after you have not eaten for a while (fasting). You may have this done every 1-3 years.  Bone density scan. This is done to screen for osteoporosis. You may have this done starting at age 70.  Mammogram. This may be done every 1-2 years. Talk to your health care provider about how often you should have regular mammograms.  Talk with your health care provider about your test results, treatment options, and if necessary, the need for more tests. Vaccines Your health care provider may recommend certain vaccines, such as:  Influenza vaccine. This is recommended every year.  Tetanus, diphtheria, and acellular pertussis (Tdap, Td) vaccine. You may need a Td booster every 10 years.  Varicella vaccine. You may need this if you have not been vaccinated.  Zoster vaccine. You may need this after age 60.  Measles, mumps, and rubella (MMR) vaccine. You may need at least one dose of MMR if you were born in 1957 or later. You may also need a second dose.  Pneumococcal 13-valent conjugate (PCV13) vaccine. One dose is recommended after age 70.  Pneumococcal polysaccharide (PPSV23) vaccine. One dose is recommended after age 70.  Meningococcal vaccine. You may need this if you have certain conditions.  Hepatitis A vaccine. You may need this if you have certain conditions or if you travel or work in places where you may be exposed to hepatitis   A.  Hepatitis B vaccine. You may need this if you have certain conditions or if you travel or work in places where you may be exposed to hepatitis B.  Haemophilus influenzae type b (Hib) vaccine. You may need this if you have certain conditions.  Talk to your health care provider about which screenings and vaccines you need and how often you  need them. This information is not intended to replace advice given to you by your health care provider. Make sure you discuss any questions you have with your health care provider. Document Released: 11/27/2015 Document Revised: 07/20/2016 Document Reviewed: 09/01/2015 Elsevier Interactive Patient Education  2018 [employer]. Cholesterol Cholesterol is a white, waxy, fat-like substance that is needed by the human body in small amounts. The liver makes all the cholesterol we need. Cholesterol is carried from the liver by the blood through the blood vessels. Deposits of cholesterol (plaques) may build up on blood vessel (artery) walls. Plaques make the arteries narrower and stiffer. Cholesterol plaques increase the risk for heart attack and stroke. You cannot feel your cholesterol level even if it is very high. The only way to know that it is high is to have a blood test. Once you know your cholesterol levels, you should keep a record of the test results. Work with your health care provider to keep your levels in the desired range. What do the results mean?  Total cholesterol is a rough measure of all the cholesterol in your blood.  LDL (low-density lipoprotein) is the "bad" cholesterol. This is the type that causes plaque to build up on the artery walls. You want this level to be low.  HDL (high-density lipoprotein) is the "good" cholesterol because it cleans the arteries and carries the LDL away. You want this level to be high.  Triglycerides are fat that the body can either burn for energy or store. High levels are closely linked to heart disease. What are the desired levels of cholesterol?  Total cholesterol below 200.  LDL below 100 for people who are at risk, below 70 for people at very high risk.  HDL above 40 is good. A level of 60 or higher is considered to be protective against heart disease.  Triglycerides below 150. How can I lower my cholesterol? Diet Follow your diet program  as told by your health care provider.  Choose fish or white meat chicken and [place], roasted or baked. Limit fatty cuts of red meat, fried foods, and processed meats, such as sausage and lunch meats.  Eat lots of fresh fruits and vegetables.  Choose whole grains, beans, pasta, potatoes, and cereals.  Choose olive oil, corn oil, or canola oil, and use only small amounts.  Avoid butter, mayonnaise, shortening, or palm kernel oils.  Avoid foods with trans fats.  Drink skim or nonfat milk and eat low-fat or nonfat yogurt and cheeses. Avoid whole milk, cream, ice cream, egg yolks, and full-fat cheeses.  Healthier desserts include angel food cake, ginger snaps, animal crackers, hard candy, popsicles, and low-fat or nonfat frozen yogurt. Avoid pastries, cakes, pies, and cookies.  Exercise  Follow your exercise program as told by your health care provider. A regular program: ? Helps to decrease LDL and raise HDL. ? Helps with weight control.  Do things that increase your activity level, such as gardening, walking, and taking the stairs.  Ask your health care provider about ways that you can be more active in your daily life.  Medicine  Take over-the-counter  and prescription medicines only as told by your health care provider. ? Medicine may be prescribed by your health care provider to help lower cholesterol and decrease the risk for heart disease. This is usually done if diet and exercise have failed to bring down cholesterol levels. ? If you have several risk factors, you may need medicine even if your levels are normal.  This information is not intended to replace advice given to you by your health care provider. Make sure you discuss any questions you have with your health care provider. Document Released: 07/26/2001 Document Revised: 05/28/2016 Document Reviewed: 04/30/2016 Elsevier Interactive Patient Education  [employer].

## 2018-01-24 ENCOUNTER — Telehealth: Payer: Self-pay

## 2018-01-24 LAB — HEMOGLOBIN A1C
Est. average glucose Bld gHb Est-mCnc: 131 mg/dL
HEMOGLOBIN A1C: 6.2 % — AB (ref 4.8–5.6)

## 2018-01-24 LAB — HEPATITIS C ANTIBODY

## 2018-01-24 NOTE — Telephone Encounter (Signed)
-----   Message from Erasmo DownerAngela M Bacigalupo, MD sent at 01/24/2018  8:48 AM EDT ----- Negative Hep C screen.  A1c shows pre-diabetes.  Recommend low carb diet and regular exercise to control blood sugars and reduce risk of progression to diabetes.  Erasmo DownerBacigalupo, Angela M, MD, MPH [facility] 01/24/2018 8:48 AM

## 2018-01-24 NOTE — Telephone Encounter (Signed)
Patient advised.KW 

## 2018-01-29 ENCOUNTER — Other Ambulatory Visit: Payer: Self-pay | Admitting: Family Medicine

## 2018-01-29 ENCOUNTER — Ambulatory Visit
Admission: RE | Admit: 2018-01-29 | Discharge: 2018-01-29 | Disposition: A | Discharge status: Home / Self Care | Payer: Medicare Other | Source: Ambulatory Visit | Attending: Family Medicine | Admitting: Family Medicine

## 2018-01-29 DIAGNOSIS — Z1231 Encounter for screening mammogram for malignant neoplasm of breast: Secondary | ICD-10-CM | POA: Diagnosis not present

## 2018-01-29 DIAGNOSIS — Z1239 Encounter for other screening for malignant neoplasm of breast: Secondary | ICD-10-CM

## 2018-01-29 DIAGNOSIS — G2581 Restless legs syndrome: Secondary | ICD-10-CM

## 2018-02-05 ENCOUNTER — Inpatient Hospital Stay
Admission: RE | Admit: 2018-02-05 | Discharge: 2018-02-05 | Disposition: A | Discharge status: Home / Self Care | Payer: Self-pay | Source: Ambulatory Visit | Attending: *Deleted | Admitting: *Deleted

## 2018-02-05 ENCOUNTER — Other Ambulatory Visit: Payer: Self-pay | Admitting: *Deleted

## 2018-02-05 DIAGNOSIS — Z9289 Personal history of other medical treatment: Secondary | ICD-10-CM

## 2018-02-06 ENCOUNTER — Telehealth: Payer: Self-pay

## 2018-02-06 NOTE — Telephone Encounter (Signed)
Pt advised.

## 2018-02-06 NOTE — Telephone Encounter (Signed)
-----   Message from Erasmo DownerAngela M Bacigalupo, MD sent at 02/06/2018  8:12 AM EDT ----- Normal mammogram

## 2018-02-14 ENCOUNTER — Ambulatory Visit
Admission: RE | Admit: 2018-02-14 | Discharge: 2018-02-14 | Disposition: A | Discharge status: Home / Self Care | Payer: Medicare Other | Source: Ambulatory Visit | Attending: Family Medicine | Admitting: Family Medicine

## 2018-02-14 ENCOUNTER — Telehealth: Payer: Self-pay

## 2018-02-14 DIAGNOSIS — Z780 Asymptomatic menopausal state: Secondary | ICD-10-CM | POA: Diagnosis not present

## 2018-02-14 DIAGNOSIS — M85851 Other specified disorders of bone density and structure, right thigh: Secondary | ICD-10-CM | POA: Insufficient documentation

## 2018-02-14 DIAGNOSIS — Z13820 Encounter for screening for osteoporosis: Secondary | ICD-10-CM | POA: Diagnosis present

## 2018-02-14 NOTE — Telephone Encounter (Signed)
-----   Message from Erasmo DownerAngela M Bacigalupo, MD sent at 02/14/2018 10:31 AM EDT ----- Bone density scan shows osteopenia (this is some bone loss, but not as bad as osteoporosis).  Recommend regular weight bearing exercise, avoiding smoking, and adequate Ca (1200mg /day) and Vit D (1000 units daily) via diet or supplement.  We will consider recheck in 2 years to ensure this hasn't worsened.  Erasmo DownerBacigalupo, Angela M, MD, MPH [facility] 02/14/2018 10:31 AM

## 2018-02-14 NOTE — Telephone Encounter (Signed)
Pt advised, and agrees with treatment plan. 

## 2018-07-26 ENCOUNTER — Encounter: Payer: Self-pay | Admitting: Family Medicine

## 2018-07-26 ENCOUNTER — Ambulatory Visit: Payer: Medicare Other | Admitting: Family Medicine

## 2018-07-26 VITALS — BP 105/74 | HR 86 | Temp 97.7°F | Resp 16 | Wt 202.0 lb

## 2018-07-26 DIAGNOSIS — M1712 Unilateral primary osteoarthritis, left knee: Secondary | ICD-10-CM

## 2018-07-26 DIAGNOSIS — M5432 Sciatica, left side: Secondary | ICD-10-CM

## 2018-07-26 DIAGNOSIS — G2581 Restless legs syndrome: Secondary | ICD-10-CM

## 2018-07-26 DIAGNOSIS — K2100 Gastro-esophageal reflux disease with esophagitis, without bleeding: Secondary | ICD-10-CM

## 2018-07-26 DIAGNOSIS — R739 Hyperglycemia, unspecified: Secondary | ICD-10-CM

## 2018-07-26 DIAGNOSIS — E785 Hyperlipidemia, unspecified: Secondary | ICD-10-CM | POA: Diagnosis not present

## 2018-07-26 DIAGNOSIS — Z23 Encounter for immunization: Secondary | ICD-10-CM

## 2018-07-26 MED ORDER — OMEPRAZOLE 20 MG PO CPDR: 20.0000 mg | DELAYED_RELEASE_CAPSULE | Freq: Every day | ORAL | 3 refills | Status: DC

## 2018-07-26 MED ORDER — TRAMADOL HCL 50 MG PO TABS: 50.0000 mg | ORAL_TABLET | Freq: Four times a day (QID) | ORAL | 0 refills | Status: DC | PRN

## 2018-07-26 NOTE — Progress Notes (Signed)
Patient: Chelsea Tyler Female    DOB: Oct 05, 1946   72 y.o.   MRN: [id number] Visit Date: 07/26/2018  Today's Provider: Mila Merryonald Laneisha Mino, MD   Chief Complaint  Patient presents with  . Hyperglycemia  . Hyperlipidemia  . Restless legs  . Gastroesophageal Reflux  . Arthritis   Subjective:    HPI  Hyperglycemia, Follow-up:   Lab Results  Component Value Date   HGBA1C 6.2 (H) 01/23/2018   GLUCOSE 107 (H) 08/08/2017    Last seen for for this 6 months ago.  Management since then includes no changes. Current symptoms include none and have been stable.  Weight trend: fluctuating a bit Prior visit with dietician: no Current diet: well balanced Current exercise: none  Pertinent Labs:   Wt Readings from Last 3 Encounters:  07/26/18 202 lb (91.6 kg)  01/23/18 206 lb (93.4 kg)  07/25/17 212 lb (96.2 kg)    Lipid/Cholesterol, Follow-up:   Last seen for this1 years ago.  Management changes since that visit include none. . Last Lipid Panel:    Component Value Date/Time   CHOL 236 (H) 08/08/2017 0942   TRIG 190 (H) 08/08/2017 0942   HDL 52 08/08/2017 0942   CHOLHDL 4.5 08/08/2017 0942   LDLCALC 151 (H) 08/08/2017 [id number]    Risk factors for vascular disease include hypercholesterolemia  She reports good compliance with treatment. She is not having side effects.  Current symptoms include none and have been stable. Weight trend: fluctuating a bit Prior visit with dietician: no Current diet: well balanced Current exercise: none    ------------------------------------------------------------------- Follow up of Restless leg: Patient was last seen for this problem 1 year ago and no changes were made. Patient reports this problem is controlled with Clonazepam which she takes every night and remains effective.   She also has GERD and required esophageal dilation by Dr. Markham JordanElliot last year. She takes ranitidine every morning and was advised by GI to take Nexium  daily, but she states she stopped because it makes her constipated.   She also requests refill for tramadol which she takes occasionally for arthritis in her left knee and left side of back. She usually takes 1 tablet, but only a few time a week. She also take meloxicam every day prescribed by Dr. Vassie MoselleMetcalf which she feels helps.     Allergies  Allergen Reactions  . Clarithromycin     Other reaction(s): Abdominal pain     Current Outpatient Medications:  .  clonazePAM (KLONOPIN) 0.5 MG tablet, TAKE 1/2 TO 1 TABLET BY MOUTH EVERY NIGHT AT BEDTIME, Disp: 30 tablet, Rfl: 5 .  meloxicam (MOBIC) 7.5 MG tablet, Take by mouth., Disp: , Rfl:  .  ranitidine (ZANTAC) 150 MG tablet, Take 150 mg by mouth 2 (two) times daily., Disp: , Rfl:  .  traMADol (ULTRAM) 50 MG tablet, Take by mouth 1-2 tablets every 4 hours as needed, Disp: 75 tablet, Rfl: 1  Review of Systems  Constitutional: Negative for appetite change, chills, fatigue and fever.  Respiratory: Negative for chest tightness and shortness of breath.   Cardiovascular: Negative for chest pain and palpitations.  Gastrointestinal: Negative for abdominal pain, nausea and vomiting.  Musculoskeletal: Positive for arthralgias (left hip and knee pain).  Neurological: Negative for dizziness and weakness.   Patient Care Team    Relationship Specialty Notifications Start End  [name], Holliday Sheaffer E, MD PCP - General Family Medicine  05/09/16   Mylinda LatinaMetcalf, Douglas Lee, MD Consulting Physician  Rheumatology  07/26/18   Scot Jun, MD  Gastroenterology  07/26/18      Social History   Tobacco Use  . Smoking status: Never Smoker  . Smokeless tobacco: Never Used  Substance Use Topics  . Alcohol use: No    Alcohol/week: 0.0 standard drinks   Objective:   BP 105/74 (BP Location: Left Arm, Patient Position: Sitting, Cuff Size: Large)   Pulse 86   Temp 97.7 F (36.5 C) (Oral)   Resp 16   Wt 202 lb (91.6 kg)   SpO2 95% Comment: room air  BMI 35.78  kg/m  Vitals:   07/26/18 0813  BP: 105/74  Pulse: 86  Resp: 16  Temp: 97.7 F (36.5 C)  TempSrc: Oral  SpO2: 95%  Weight: 202 lb (91.6 kg)     Physical Exam  General Appearance:    Alert, cooperative, no distress, obese  Eyes:    PERRL, conjunctiva/corneas clear, EOM's intact       Lungs:     Clear to auscultation bilaterally, respirations unlabored  Heart:    Regular rate and rhythm  Neurologic:   Awake, alert, oriented x 3. No apparent focal neurological           defect.           Assessment & Plan:     1. Hyperglycemia  - Hemoglobin A1c  2. Hyperlipidemia, unspecified hyperlipidemia type Diet controlled.  - Lipid panel - TSH - Comprehensive metabolic panel  3. Need for influenza vaccination  - Flu vaccine HIGH DOSE PF (Fluzone High dose)  4. Arthritis of left knee Refill tramadol which she only takes occasionally.   5. Sciatica of left side  - traMADol (ULTRAM) 50 MG tablet; Take 1 tablet (50 mg total) by mouth every 6 (six) hours as needed.  Dispense: 120 tablet; Refill: 0  6. Gastroesophageal reflux disease with esophagitis She is not taking Nexium recommended by GI, which she states makes her constipated. Will try- omeprazole (PRILOSEC) 20 MG capsule; Take 1 capsule (20 mg total) by mouth daily.  Dispense: 30 capsule; Refill: 3 in its place.   7. Restless leg Continue current dose of clonazepam which is working well.        Mila Merry, MD  [facility]

## 2018-07-27 LAB — COMPREHENSIVE METABOLIC PANEL
ALK PHOS: 75 IU/L (ref 39–117)
ALT: 21 IU/L (ref 0–32)
AST: 17 IU/L (ref 0–40)
Albumin/Globulin Ratio: 2.1 (ref 1.2–2.2)
Albumin: 4.6 g/dL (ref 3.5–4.8)
BUN/Creatinine Ratio: 13 (ref 12–28)
BUN: 11 mg/dL (ref 8–27)
Bilirubin Total: 1.3 mg/dL — ABNORMAL HIGH (ref 0.0–1.2)
CALCIUM: 9.6 mg/dL (ref 8.7–10.3)
CO2: 21 mmol/L (ref 20–29)
CREATININE: 0.85 mg/dL (ref 0.57–1.00)
Chloride: 102 mmol/L (ref 96–106)
GFR calc Af Amer: 80 mL/min/{1.73_m2} (ref >59)
GFR, EST NON AFRICAN AMERICAN: 70 mL/min/{1.73_m2} (ref >59)
GLOBULIN, TOTAL: 2.2 g/dL (ref 1.5–4.5)
GLUCOSE: 108 mg/dL — AB (ref 65–99)
Potassium: 4.4 mmol/L (ref 3.5–5.2)
SODIUM: 139 mmol/L (ref 134–144)
Total Protein: 6.8 g/dL (ref 6.0–8.5)

## 2018-07-27 LAB — LIPID PANEL
CHOL/HDL RATIO: 4.3 ratio (ref 0.0–4.4)
Cholesterol, Total: 248 mg/dL — ABNORMAL HIGH (ref 100–199)
HDL: 58 mg/dL (ref >39)
LDL Calculated: 160 mg/dL — ABNORMAL HIGH (ref 0–99)
TRIGLYCERIDES: 148 mg/dL (ref 0–149)
VLDL Cholesterol Cal: 30 mg/dL (ref 5–40)

## 2018-07-27 LAB — TSH: TSH: 2 u[IU]/mL (ref 0.450–4.500)

## 2018-07-27 LAB — HEMOGLOBIN A1C
ESTIMATED AVERAGE GLUCOSE: 123 mg/dL
HEMOGLOBIN A1C: 5.9 % — AB (ref 4.8–5.6)

## 2018-08-07 DIAGNOSIS — H43812 Vitreous degeneration, left eye: Secondary | ICD-10-CM | POA: Diagnosis not present

## 2018-08-17 ENCOUNTER — Other Ambulatory Visit: Payer: Self-pay | Admitting: Family Medicine

## 2018-08-17 DIAGNOSIS — G2581 Restless legs syndrome: Secondary | ICD-10-CM

## 2018-08-17 NOTE — Telephone Encounter (Signed)
Pharmacy requesting refills. Thanks!  

## 2018-09-18 DIAGNOSIS — H43812 Vitreous degeneration, left eye: Secondary | ICD-10-CM | POA: Diagnosis not present

## 2018-11-15 ENCOUNTER — Other Ambulatory Visit: Payer: Self-pay | Admitting: Family Medicine

## 2018-11-15 DIAGNOSIS — K2100 Gastro-esophageal reflux disease with esophagitis, without bleeding: Secondary | ICD-10-CM

## 2019-03-05 ENCOUNTER — Other Ambulatory Visit: Payer: Self-pay | Admitting: Family Medicine

## 2019-03-05 DIAGNOSIS — G2581 Restless legs syndrome: Secondary | ICD-10-CM

## 2019-03-05 NOTE — Telephone Encounter (Signed)
Pharmacy requesting refills. Thanks!  

## 2019-07-30 ENCOUNTER — Ambulatory Visit (INDEPENDENT_AMBULATORY_CARE_PROVIDER_SITE_OTHER): Payer: Medicare Other | Admitting: Family Medicine

## 2019-07-30 ENCOUNTER — Other Ambulatory Visit: Payer: Self-pay

## 2019-07-30 DIAGNOSIS — Z23 Encounter for immunization: Secondary | ICD-10-CM

## 2019-09-10 ENCOUNTER — Other Ambulatory Visit: Payer: Self-pay | Admitting: Family Medicine

## 2019-09-10 DIAGNOSIS — G2581 Restless legs syndrome: Secondary | ICD-10-CM

## 2019-09-14 ENCOUNTER — Emergency Department: Payer: Medicare Other

## 2019-09-14 ENCOUNTER — Encounter: Payer: Self-pay | Admitting: Emergency Medicine

## 2019-09-14 ENCOUNTER — Ambulatory Visit (INDEPENDENT_AMBULATORY_CARE_PROVIDER_SITE_OTHER)
Admission: EM | Admit: 2019-09-14 | Discharge: 2019-09-14 | Disposition: A | Discharge status: Home / Self Care | Payer: Medicare Other | Source: Home / Self Care | Attending: Family Medicine | Admitting: Family Medicine

## 2019-09-14 ENCOUNTER — Emergency Department
Admission: EM | Admit: 2019-09-14 | Discharge: 2019-09-14 | Disposition: A | Discharge status: Home / Self Care | Payer: Medicare Other | Attending: Emergency Medicine | Admitting: Emergency Medicine

## 2019-09-14 ENCOUNTER — Other Ambulatory Visit: Payer: Self-pay

## 2019-09-14 DIAGNOSIS — R1030 Lower abdominal pain, unspecified: Secondary | ICD-10-CM

## 2019-09-14 DIAGNOSIS — K5792 Diverticulitis of intestine, part unspecified, without perforation or abscess without bleeding: Secondary | ICD-10-CM | POA: Diagnosis not present

## 2019-09-14 DIAGNOSIS — Z79899 Other long term (current) drug therapy: Secondary | ICD-10-CM | POA: Insufficient documentation

## 2019-09-14 DIAGNOSIS — K5732 Diverticulitis of large intestine without perforation or abscess without bleeding: Secondary | ICD-10-CM | POA: Diagnosis not present

## 2019-09-14 DIAGNOSIS — K8020 Calculus of gallbladder without cholecystitis without obstruction: Secondary | ICD-10-CM | POA: Diagnosis not present

## 2019-09-14 LAB — COMPREHENSIVE METABOLIC PANEL
ALT: 24 U/L (ref 0–44)
AST: 18 U/L (ref 15–41)
Albumin: 4.1 g/dL (ref 3.5–5.0)
Alkaline Phosphatase: 77 U/L (ref 38–126)
Anion gap: 10 (ref 5–15)
BUN: 6 mg/dL — ABNORMAL LOW (ref 8–23)
CO2: 25 mmol/L (ref 22–32)
Calcium: 9.2 mg/dL (ref 8.9–10.3)
Chloride: 103 mmol/L (ref 98–111)
Creatinine, Ser: 0.62 mg/dL (ref 0.44–1.00)
GFR calc Af Amer: >60 mL/min (ref >60)
GFR calc non Af Amer: >60 mL/min (ref >60)
Glucose, Bld: 136 mg/dL — ABNORMAL HIGH (ref 70–99)
Potassium: 3.9 mmol/L (ref 3.5–5.1)
Sodium: 138 mmol/L (ref 135–145)
Total Bilirubin: 1.1 mg/dL (ref 0.3–1.2)
Total Protein: 7.5 g/dL (ref 6.5–8.1)

## 2019-09-14 LAB — CBC
HCT: 42.2 % (ref 36.0–46.0)
Hemoglobin: 13.8 g/dL (ref 12.0–15.0)
MCH: 28.9 pg (ref 26.0–34.0)
MCHC: 32.7 g/dL (ref 30.0–36.0)
MCV: 88.5 fL (ref 80.0–100.0)
Platelets: 350 10*3/uL (ref 150–400)
RBC: 4.77 MIL/uL (ref 3.87–5.11)
RDW: 13.2 % (ref 11.5–15.5)
WBC: 14.1 10*3/uL — ABNORMAL HIGH (ref 4.0–10.5)
nRBC: 0 % (ref 0.0–0.2)

## 2019-09-14 LAB — CBC WITH DIFFERENTIAL/PLATELET
Abs Immature Granulocytes: 0.06 10*3/uL (ref 0.00–0.07)
Basophils Absolute: 0 10*3/uL (ref 0.0–0.1)
Basophils Relative: 0 %
Eosinophils Absolute: 0 10*3/uL (ref 0.0–0.5)
Eosinophils Relative: 0 %
HCT: 42.2 % (ref 36.0–46.0)
Hemoglobin: 13.8 g/dL (ref 12.0–15.0)
Immature Granulocytes: 0 %
Lymphocytes Relative: 15 %
Lymphs Abs: 2.2 10*3/uL (ref 0.7–4.0)
MCH: 28.9 pg (ref 26.0–34.0)
MCHC: 32.7 g/dL (ref 30.0–36.0)
MCV: 88.5 fL (ref 80.0–100.0)
Monocytes Absolute: 1.4 10*3/uL — ABNORMAL HIGH (ref 0.1–1.0)
Monocytes Relative: 10 %
Neutro Abs: 10.7 10*3/uL — ABNORMAL HIGH (ref 1.7–7.7)
Neutrophils Relative %: 75 %
Platelets: 381 10*3/uL (ref 150–400)
RBC: 4.77 MIL/uL (ref 3.87–5.11)
RDW: 13.3 % (ref 11.5–15.5)
WBC: 14.4 10*3/uL — ABNORMAL HIGH (ref 4.0–10.5)
nRBC: 0 % (ref 0.0–0.2)

## 2019-09-14 LAB — URINALYSIS, COMPLETE (UACMP) WITH MICROSCOPIC
Bilirubin Urine: NEGATIVE
Glucose, UA: NEGATIVE mg/dL
Hgb urine dipstick: NEGATIVE
Ketones, ur: NEGATIVE mg/dL
Leukocytes,Ua: NEGATIVE
Nitrite: NEGATIVE
Protein, ur: NEGATIVE mg/dL
RBC / HPF: NONE SEEN RBC/hpf (ref 0–5)
Specific Gravity, Urine: 1.015 (ref 1.005–1.030)
pH: 7 (ref 5.0–8.0)

## 2019-09-14 LAB — LIPASE, BLOOD: Lipase: 29 U/L (ref 11–51)

## 2019-09-14 MED ORDER — SODIUM CHLORIDE 0.9 % IV SOLN
Freq: Once | INTRAVENOUS | Status: AC
  Administered 2019-09-14: 16:00:00 via INTRAVENOUS

## 2019-09-14 MED ORDER — CIPROFLOXACIN HCL 500 MG PO TABS
500.0000 mg | ORAL_TABLET | Freq: Once | ORAL | Status: AC
  Administered 2019-09-14: 500 mg via ORAL
  Filled 2019-09-14: qty 1

## 2019-09-14 MED ORDER — IOHEXOL 9 MG/ML PO SOLN
500.0000 mL | Freq: Two times a day (BID) | ORAL | Status: DC | PRN
  Administered 2019-09-14 (×2): 500 mL via ORAL
  Filled 2019-09-14 (×2): qty 500

## 2019-09-14 MED ORDER — ONDANSETRON HCL 4 MG/2ML IJ SOLN
4.0000 mg | Freq: Once | INTRAMUSCULAR | Status: AC
  Administered 2019-09-14: 4 mg via INTRAVENOUS
  Filled 2019-09-14: qty 2

## 2019-09-14 MED ORDER — METRONIDAZOLE IN NACL 5-0.79 MG/ML-% IV SOLN: 500.0000 mg | Freq: Once | INTRAVENOUS | Status: DC

## 2019-09-14 MED ORDER — IOHEXOL 300 MG/ML  SOLN
100.0000 mL | Freq: Once | INTRAMUSCULAR | Status: AC | PRN
  Administered 2019-09-14: 100 mL via INTRAVENOUS

## 2019-09-14 MED ORDER — SODIUM CHLORIDE 0.9% FLUSH: 3.0000 mL | Freq: Once | INTRAVENOUS | Status: DC

## 2019-09-14 MED ORDER — METRONIDAZOLE 500 MG PO TABS
500.0000 mg | ORAL_TABLET | Freq: Once | ORAL | Status: AC
  Administered 2019-09-14: 500 mg via ORAL
  Filled 2019-09-14: qty 1

## 2019-09-14 MED ORDER — HYDROCODONE-ACETAMINOPHEN 5-325 MG PO TABS: 1.0000 | ORAL_TABLET | Freq: Four times a day (QID) | ORAL | 0 refills | Status: DC | PRN

## 2019-09-14 MED ORDER — METRONIDAZOLE 500 MG PO TABS: 500.0000 mg | ORAL_TABLET | Freq: Three times a day (TID) | ORAL | 0 refills | Status: AC

## 2019-09-14 MED ORDER — MORPHINE SULFATE (PF) 2 MG/ML IV SOLN
2.0000 mg | Freq: Once | INTRAVENOUS | Status: AC
  Administered 2019-09-14: 2 mg via INTRAVENOUS
  Filled 2019-09-14: qty 1

## 2019-09-14 MED ORDER — CIPROFLOXACIN HCL 500 MG PO TABS: 500.0000 mg | ORAL_TABLET | Freq: Two times a day (BID) | ORAL | 0 refills | Status: AC

## 2019-09-14 NOTE — ED Provider Notes (Signed)
[facility]    CSN: [id number] Arrival date & time: 09/14/19  1325  History   Chief Complaint Chief Complaint  Patient presents with  . Abdominal Pain   HPI  73 year old female presents for abdominal pain.  Patient reports that she has had symptoms for the past 2 days.  She reports severe lower abdominal pain.  Was worse last night.  Interferes with sleep last night.  Rates her pain as 6/10 in severity.  She reports associated low back pain.  Also reports that she has increasing abdominal pain when she moves and when she urinates.  Denies dysuria.  Denies frequency and urgency.  No documented fever.  No known relieving factors.  No other complaints.  PMH, Surgical Hx, Family Hx, Social History reviewed and updated as below.  Patient Active Problem List   Diagnosis Date Noted  . Hyperglycemia 01/23/2018  . Allergic rhinitis 05/06/2016  . DD (diverticular disease) 05/06/2016  . GERD (gastroesophageal reflux disease) 05/06/2016  . Hiatal hernia 05/06/2016  . Personal history of other diseases of the musculoskeletal system and connective tissue 05/06/2016  . HLD (hyperlipidemia) 05/06/2016  . Arthritis of left knee 05/06/2016  . Restless leg 05/06/2016  . Sacroiliitis, not elsewhere classified ([place]) 07/30/2013   Past Surgical History:  Procedure Laterality Date  . ABDOMINAL HYSTERECTOMY    . APPENDECTOMY    . HYSTEROTOMY  1987  . TONSILLECTOMY    . TUBAL LIGATION    . UPPER GI ENDOSCOPY     10/15/02  . UVULOPLASTY     OB History    Gravida  3   Para  3   Term      Preterm      AB      Living        SAB      TAB      Ectopic      Multiple      Live Births             Home Medications    Prior to Admission medications   Medication Sig Start Date End Date Taking? Authorizing Provider  clonazePAM (KLONOPIN) 0.5 MG tablet TAKE 1/2 TO 1 TABLET BY MOUTH EVERY NIGHT AT BEDTIME 09/12/19  Yes Birdie Sons, MD  meloxicam (MOBIC) 7.5 MG  tablet Take by mouth.   Yes [provider]  omeprazole (PRILOSEC) 20 MG capsule TAKE 1 CAPSULE(20 MG) BY MOUTH DAILY 11/15/18  Yes Birdie Sons, MD  traMADol (ULTRAM) 50 MG tablet Take 1 tablet (50 mg total) by mouth every 6 (six) hours as needed. 07/26/18  Yes Birdie Sons, MD  ranitidine (ZANTAC) 150 MG tablet Take 150 mg by mouth 2 (two) times daily.  09/14/19  [provider]    Family History Family History  Problem Relation Age of Onset  . Stomach cancer Father   . Healthy Daughter   . Healthy Daughter   . Breast cancer Mother   . Arthritis Mother   . Heart disease Mother   . Ovarian cancer Mother   . Healthy Sister   . Healthy Sister     Social History Social History   Tobacco Use  . Smoking status: Never Smoker  . Smokeless tobacco: Never Used  Substance Use Topics  . Alcohol use: No    Alcohol/week: 0.0 standard drinks  . Drug use: No     Allergies   Clarithromycin   Review of Systems Review of Systems  Constitutional: Negative for  fever.  Gastrointestinal: Positive for abdominal pain.  Genitourinary: Negative.   Musculoskeletal: Positive for back pain.   Physical Exam Triage Vital Signs ED Triage Vitals  Enc Vitals Group     BP 09/14/19 1354 (!) 145/89     Pulse Rate 09/14/19 1354 (!) 107     Resp 09/14/19 1354 18     Temp 09/14/19 1354 98.4 F (36.9 C)     Temp Source 09/14/19 1354 Oral     SpO2 09/14/19 1354 98 %     Weight 09/14/19 1352 198 lb (89.8 kg)     Height --      Head Circumference --      Peak Flow --      Pain Score 09/14/19 1430 6     Pain Loc --      Pain Edu? --      Excl. in GC? --    Updated Vital Signs BP (!) 145/89   Pulse (!) 107   Temp 98.4 F (36.9 C) (Oral)   Resp 18   Wt 89.8 kg   SpO2 98%   BMI 35.07 kg/m   Visual Acuity Right Eye Distance:   Left Eye Distance:   Bilateral Distance:    Right Eye Near:   Left Eye Near:    Bilateral Near:     Physical Exam Vitals signs and  nursing note reviewed.  Constitutional:      Appearance: She is obese.     Comments: Appears in pain but in no acute distress.  HENT:     Head: Normocephalic and atraumatic.  Eyes:     General:        Right eye: No discharge.        Left eye: No discharge.     Conjunctiva/sclera: Conjunctivae normal.  Cardiovascular:     Rate and Rhythm: Regular rhythm. Tachycardia present.  Pulmonary:     Effort: Pulmonary effort is normal.     Breath sounds: Normal breath sounds. No wheezing, rhonchi or rales.  Abdominal:     Hernia: No hernia is present.     Comments: Soft, nondistended.  Exquisitely tender to palpation throughout the lower abdomen.  Guarding noted.  Neurological:     Mental Status: She is alert.  Psychiatric:        Mood and Affect: Mood normal.        Behavior: Behavior normal.    UC Treatments / Results  Labs (all labs ordered are listed, but only abnormal results are displayed) Labs Reviewed  URINALYSIS, COMPLETE (UACMP) WITH MICROSCOPIC - Abnormal; Notable for the following components:      Result Value   Bacteria, UA RARE (*)    All other components within normal limits    EKG   Radiology No results found.  Procedures Procedures (including critical care time)  Medications Ordered in UC Medications - No data to display  Initial Impression / Assessment and Plan / UC Course  I have reviewed the triage vital signs and the nursing notes.  Pertinent labs & imaging results that were available during my care of the patient were reviewed by me and considered in my medical decision making (see chart for details).    73 year old female presents with lower abdominal pain.  No evidence of UTI.  Etiology unclear at this time.  Patient needs laboratory studies and CT imaging.  CT not available at our site today.  Sending to the ER.  ER notified.  Final Clinical Impressions(s) / UC  Diagnoses   Final diagnoses:  Lower abdominal pain     Discharge Instructions      Go to the ER.  I will call and let them know you are on the way.  Take care  Dr. Adriana Simasook    ED Prescriptions    None     PDMP not reviewed this encounter.   Tommie SamsCook, Danalee Flath G, [place]DO 09/14/19 1511

## 2019-09-14 NOTE — ED Notes (Signed)
First Nurse Note: Pt sent from [facility] for abdominal pain. Pt needs CT scan

## 2019-09-14 NOTE — ED Notes (Signed)
Pt assisted to bedside toilet at this time

## 2019-09-14 NOTE — ED Triage Notes (Signed)
Patient in office today lower abd. Pain, LBP,movement is painful symptoms started x2d  EEF:[id number]

## 2019-09-14 NOTE — Discharge Instructions (Addendum)
Please take the Cipro 1 twice a day and Flagyl 1 3 times a day.  Please return for increasing pain fever or vomiting.  Also please return if you are feeling sicker.  Please follow-up with your doctor later on this coming week.  I will give you a little bit of Vicodin if needed for the pain.  Be careful can make you woozy do not fall.  Do not drive on it.  They can also make you constipated.

## 2019-09-14 NOTE — Discharge Instructions (Signed)
Go to the ER.  I will call and let them know you are on the way.  Take care  Dr. Lacinda Axon

## 2019-09-14 NOTE — ED Provider Notes (Addendum)
[facility] Emergency Department Provider Note   ____________________________________________   First MD Initiated Contact with Patient 09/14/19 1527     (approximate)  I have reviewed the triage vital signs and the nursing notes.   HISTORY  Chief Complaint Abdominal Pain    HPI Chelsea NipperSandra L Tyler is a 73 y.o. female who reports onset of lower abdominal pain Thursday.  She reports standing up or moving makes the pain worse.  When she stands up it feels like everything is going to come out of her belly when she sits down it feels like everything is being shoved up inside her.  She reports this pain is under the umbilicus.  There is no pelvic pain.  She has no nausea vomiting or diarrhea.  She is not running a fever.  The pain came on by itself she did not lift anything or do anything else to bring it on.  Pain is moderately severe.  Deep and achy.    Patient reports her husband is in a red Toyota and the first handicap spot outside the door.     History reviewed. No pertinent past medical history.  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   GERD (gastroesophageal reflux disease) 05/06/2016   Hiatal hernia 05/06/2016   Personal history of other diseases of the musculoskeletal system and connective tissue 05/06/2016   HLD (hyperlipidemia) 05/06/2016   Arthritis of left knee 05/06/2016   Restless leg 05/06/2016   Sacroiliitis, not elsewhere classified ([facility]) 07/30/2013    Past Surgical History:  Procedure Laterality Date   ABDOMINAL HYSTERECTOMY     APPENDECTOMY     HYSTEROTOMY  1987   TONSILLECTOMY     TUBAL LIGATION     UPPER GI ENDOSCOPY     10/15/02   UVULOPLASTY      Prior to Admission medications   Medication Sig Start Date End Date Taking? Authorizing Provider  ciprofloxacin (CIPRO) 500 MG tablet Take 1 tablet (500  mg total) by mouth 2 (two) times daily for 10 days. 09/14/19 09/24/19  Arnaldo NatalMalinda, [name], MD  clonazePAM (KLONOPIN) 0.5 MG tablet TAKE 1/2 TO 1 TABLET BY MOUTH EVERY NIGHT AT BEDTIME 09/12/19   [name], Donald E, MD  HYDROcodone-acetaminophen (NORCO/VICODIN) 5-325 MG tablet Take 1 tablet by mouth every 6 (six) hours as needed for moderate pain. 09/14/19   Arnaldo NatalMalinda, [name], MD  meloxicam (MOBIC) 7.5 MG tablet Take by mouth.    [provider]  metroNIDAZOLE (FLAGYL) 500 MG tablet Take 1 tablet (500 mg total) by mouth 3 (three) times daily for 7 days. 09/14/19 09/21/19  Arnaldo NatalMalinda, [name], MD  omeprazole (PRILOSEC) 20 MG capsule TAKE 1 CAPSULE(20 MG) BY MOUTH DAILY 11/15/18   [name], Donald E, MD  traMADol (ULTRAM) 50 MG tablet Take 1 tablet (50 mg total) by mouth every 6 (six) hours as needed. 07/26/18   [name], Donald E, MD  ranitidine (ZANTAC) 150 MG tablet Take 150 mg by mouth 2 (two) times daily.  09/14/19  [provider]    Allergies Clarithromycin  Family History  Problem Relation Age of Onset   Stomach cancer Father    Healthy Daughter    Healthy Daughter    Breast cancer Mother    Arthritis Mother    Heart disease Mother    Ovarian cancer Mother    Healthy Sister    Healthy Sister  Social History Social History   Tobacco Use   Smoking status: Never Smoker   Smokeless tobacco: Never Used  Substance Use Topics   Alcohol use: No    Alcohol/week: 0.0 standard drinks   Drug use: No    Review of Systems  Constitutional: No fever/chills Eyes: No visual changes. ENT: No sore throat. Cardiovascular: Denies chest pain. Respiratory: Denies shortness of breath. Gastrointestinal:  abdominal pain.  No nausea, no vomiting.  No diarrhea.  No constipation. Genitourinary: Negative for dysuria. Musculoskeletal: Negative for leg pain Skin: Negative for rash. Neurological: Negative for headaches, focal weakness or  numbness.  ____________________________________________   PHYSICAL EXAM:  VITAL SIGNS: ED Triage Vitals [09/14/19 1504]  Enc Vitals Group     BP 137/85     Pulse Rate (!) 111     Resp 20     Temp 98.4 F (36.9 C)     Temp Source Oral     SpO2 98 %     Weight 198 lb (89.8 kg)     Height 5\' 2"  (1.575 m)     Head Circumference      Peak Flow      Pain Score 9     Pain Loc      Pain Edu?      Excl. in GC?     Constitutional: Alert and oriented. Well appearing and in no acute distress. Eyes: Conjunctivae are normal. Head: Atraumatic. Nose: No congestion/rhinnorhea. Mouth/Throat: Mucous membranes are moist.  Oropharynx non-erythematous. Neck: No stridor.Cardiovascular: Normal rate, regular rhythm. Grossly normal heart sounds.  Good peripheral circulation. Respiratory: Normal respiratory effort.  No retractions. Lungs CTAB. Gastrointestinal: Soft tender to palpation across the lower abdomen especially midline below the umbilicus.. No distention. No abdominal bruits. No CVA tenderness. Musculoskeletal: No lower extremity tenderness nor edema.   Neurologic:  Normal speech and language. No gross focal neurologic deficits are appreciated. Skin:  Skin is warm, dry and intact. No rash noted.   ____________________________________________   LABS (all labs ordered are listed, but only abnormal results are displayed)  Labs Reviewed  COMPREHENSIVE METABOLIC PANEL - Abnormal; Notable for the following components:      Result Value   Glucose, Bld 136 (*)    BUN 6 (*)    All other components within normal limits  CBC - Abnormal; Notable for the following components:   WBC 14.1 (*)    All other components within normal limits  CBC WITH DIFFERENTIAL/PLATELET - Abnormal; Notable for the following components:   WBC 14.4 (*)    Neutro Abs 10.7 (*)    Monocytes Absolute 1.4 (*)    All other components within normal limits  LIPASE, BLOOD    ____________________________________________  EKG   ____________________________________________  RADIOLOGY  ED MD interpretation:   Official radiology report(s): Ct Abdomen Pelvis W Contrast  Result Date: 09/14/2019 CLINICAL DATA:  Mid to lower abdominal pain. EXAM: CT ABDOMEN AND PELVIS WITH CONTRAST TECHNIQUE: Multidetector CT imaging of the abdomen and pelvis was performed using the standard protocol following bolus administration of intravenous contrast. CONTRAST:  09/16/2019 OMNIPAQUE IOHEXOL 300 MG/ML  SOLN COMPARISON:  None. FINDINGS: Lower chest: The lung bases are clear of an acute process. No pulmonary lesions or pleural effusions. The heart is normal in size. No pericardial effusion. Small hiatal hernia. Hepatobiliary: No focal hepatic lesions or intrahepatic biliary dilatation. There is a 2.5 cm gallstone noted the gallbladder but no findings for acute cholecystitis. Pancreas: No mass, inflammation or ductal dilatation. Spleen:  Normal size.  No focal lesions. Adrenals/Urinary Tract: The adrenal glands and kidneys are unremarkable. No renal lesions or renal calculi. No obstructing ureteral calculi. 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 The bladder is unremarkable.  Mild bladder distention. Stomach/Bowel: The stomach, duodenum and small bowel are unremarkable. No acute inflammatory changes, mass lesions or obstructive findings. The terminal ileum is normal. The appendix is not visualized and may be surgically absent. There is acute uncomplicated diverticulitis involving the mid to lower sigmoid colon with marked wall thickening, submucosal edema and pericolonic inflammatory changes. No free air or abscess. Significant underlying sigmoid colon diverticulosis. The remainder of the colon is unremarkable. No mass or inflammation or obstruction. Vascular/Lymphatic: Scattered atherosclerotic calcifications involving the aorta and iliac arteries but no aneurysm or  dissection. The branch vessels are patent. The major venous structures are patent. Small scattered mesenteric and retroperitoneal lymph nodes but no mass or overt adenopathy. Reproductive: The uterus is surgically absent. Both ovaries are still present and appear normal. Other: No pelvic mass or adenopathy. No free pelvic fluid collections. No inguinal mass or adenopathy. No abdominal wall hernia or subcutaneous lesions. Musculoskeletal: No significant bony findings. IMPRESSION: 1. Acute uncomplicated diverticulitis involving the mid to lower sigmoid colon. 2. No other acute abdominal/pelvic findings and no mass lesions or adenopathy. 3. Cholelithiasis but no CT findings for acute cholecystitis. 4. 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 Electronically Signed   By: Marijo Sanes M.D.   On: 09/14/2019 17:27    ____________________________________________   PROCEDURES  Procedure(s) performed (including Critical Care):  Procedures   ____________________________________________   INITIAL IMPRESSION / ASSESSMENT AND PLAN / ED COURSE  CT shows diverticulitis.  Patient wants to go home she does not want to stay in the hospital.  I will let her go on Cipro and Flagyl and give her dose here.  She will return if she has any problems.   Patient offered admission but she refuses          ____________________________________________   FINAL CLINICAL IMPRESSION(S) / ED DIAGNOSES  Final diagnoses:  Diverticulitis     ED Discharge Orders         Ordered    ciprofloxacin (CIPRO) 500 MG tablet  2 times daily     09/14/19 1855    metroNIDAZOLE (FLAGYL) 500 MG tablet  3 times daily     09/14/19 1855    HYDROcodone-acetaminophen (NORCO/VICODIN) 5-325 MG tablet  Every 6 hours PRN     09/14/19 1855           Note:  This document was prepared using Dragon voice recognition software and may include unintentional dictation errors.    Nena Polio, MD 09/14/19 Mauro Kaufmann     Nena Polio, MD 09/14/19 [phone]

## 2019-09-14 NOTE — ED Triage Notes (Signed)
Lower mid abdominal pain x 2 days, increased with movement or urination/

## 2019-09-16 ENCOUNTER — Ambulatory Visit (INDEPENDENT_AMBULATORY_CARE_PROVIDER_SITE_OTHER): Payer: Medicare Other | Admitting: Family Medicine

## 2019-09-16 ENCOUNTER — Encounter: Payer: Self-pay | Admitting: Family Medicine

## 2019-09-16 ENCOUNTER — Other Ambulatory Visit: Payer: Self-pay

## 2019-09-16 VITALS — BP 123/73 | HR 69 | Temp 96.9°F | Wt 211.8 lb

## 2019-09-16 DIAGNOSIS — K5792 Diverticulitis of intestine, part unspecified, without perforation or abscess without bleeding: Secondary | ICD-10-CM | POA: Diagnosis not present

## 2019-09-16 DIAGNOSIS — K5790 Diverticulosis of intestine, part unspecified, without perforation or abscess without bleeding: Secondary | ICD-10-CM | POA: Diagnosis not present

## 2019-09-16 NOTE — Progress Notes (Signed)
Patient: Chelsea Tyler Female    DOB: 1946-09-29   73 y.o.   MRN: [id number] Visit Date: 09/16/2019  Today's Provider: Lelon Huh, MD   Chief Complaint  Patient presents with  . ER Follow Up   Subjective:     HPI  Follow up ER visit  Patient was seen in ER for abdominal pain on 09/14/2019. She was treated for Diverticulitis. Treatment for this included Cipro, Flagyl, and Hydrocodone. Has only taken 2 hydrocodone/apap.  She reports excellent compliance with treatment. She reports this condition is gradually improving . Feels like it is about 80% better. Has been on liquid diet. No fevers, chills, sweats, nausea, or vomiting.  Reviewed ED records, labs, and imaging studies.   ------------------------------------------------------------------------------------   Allergies  Allergen Reactions  . Clarithromycin     Other reaction(s): Abdominal pain     Current Outpatient Medications:  .  ciprofloxacin (CIPRO) 500 MG tablet, Take 1 tablet (500 mg total) by mouth 2 (two) times daily for 10 days., Disp: 20 tablet, Rfl: 0 .  clonazePAM (KLONOPIN) 0.5 MG tablet, TAKE 1/2 TO 1 TABLET BY MOUTH EVERY NIGHT AT BEDTIME, Disp: 30 tablet, Rfl: 5 .  HYDROcodone-acetaminophen (NORCO/VICODIN) 5-325 MG tablet, Take 1 tablet by mouth every 6 (six) hours as needed for moderate pain., Disp: 8 tablet, Rfl: 0 .  meloxicam (MOBIC) 7.5 MG tablet, Take by mouth., Disp: , Rfl:  .  metroNIDAZOLE (FLAGYL) 500 MG tablet, Take 1 tablet (500 mg total) by mouth 3 (three) times daily for 7 days., Disp: 21 tablet, Rfl: 0 .  omeprazole (PRILOSEC) 20 MG capsule, TAKE 1 CAPSULE(20 MG) BY MOUTH DAILY, Disp: 30 capsule, Rfl: 12 .  traMADol (ULTRAM) 50 MG tablet, Take 1 tablet (50 mg total) by mouth every 6 (six) hours as needed., Disp: 120 tablet, Rfl: 0  Review of Systems  Constitutional: Negative for appetite change, chills and fatigue.  Respiratory: Negative for chest tightness and shortness of  breath.   Cardiovascular: Negative for palpitations.  Gastrointestinal: Positive for abdominal pain. Negative for nausea and vomiting.  Neurological: Negative for dizziness.    Social History   Tobacco Use  . Smoking status: Never Smoker  . Smokeless tobacco: Never Used  Substance Use Topics  . Alcohol use: No    Alcohol/week: 0.0 standard drinks      Objective:   BP 123/73 (BP Location: Right Arm, Patient Position: Sitting, Cuff Size: Large)   Pulse 69   Temp (!) 96.9 F (36.1 C) (Temporal)   Wt 211 lb 12.8 oz (96.1 kg)   BMI 38.74 kg/m    Physical Exam  General Appearance:    Mildly obese female, alert, cooperative, in no acute distress  Eyes:    PERRL, conjunctiva/corneas clear, EOM's intact       Lungs:     Clear to auscultation bilaterally, respirations unlabored  Heart:    Normal heart rate. Normal rhythm. No murmurs, rubs, or gallops.   Abdomen:   bowel sounds present and normal in all 4 quadrants, soft, round, nondistended. Mild LLQ and suprapubic tenderness. Marland Kitchen No CVA tenderness      The entirety of the information documented in the History of Present Illness, Review of Systems and Physical Exam were personally obtained by me. Portions of this information were initially documented by Meyer Cory, CMA and reviewed by me for thoroughness and accuracy.      Assessment & Plan    1. Diverticulitis Much improved on  day 3 antibiotic prescribed from ER. Dietary precautions discussed. Is to finish antibiotic and call if not continuing to imrove  2. DD (diverticular disease)      Mila Merry, MD  [facility]

## 2019-09-16 NOTE — Patient Instructions (Addendum)
. Please review the attached list of medications and notify my office if there are any errors.   . Please bring all of your medications to every appointment so we can make sure that our medication list is the same as yours.     Diverticulosis  Diverticulosis is a condition that develops when small pouches (diverticula) form in the wall of the large intestine (colon). The colon is where water is absorbed and stool is formed. The pouches form when the inside layer of the colon pushes through weak spots in the outer layers of the colon. You may have a few pouches or many of them. What are the causes? The cause of this condition is not known. What increases the risk? The following factors may make you more likely to develop this condition: Being older than age 3. Your risk for this condition increases with age. Diverticulosis is rare among people younger than age 9. By age 5, many people have it. Eating a low-fiber diet. Having frequent constipation. Being overweight. Not getting enough exercise. Smoking. Taking over-the-counter pain medicines, like aspirin and ibuprofen. Having a family history of diverticulosis. What are the signs or symptoms? In most people, there are no symptoms of this condition. If you do have symptoms, they may include: Bloating. Cramps in the abdomen. Constipation or diarrhea. Pain in the lower left side of the abdomen. How is this diagnosed? This condition is most often diagnosed during an exam for other colon problems. Because diverticulosis usually has no symptoms, it often cannot be diagnosed independently. This condition may be diagnosed by: Using a flexible scope to examine the colon (colonoscopy). Taking an X-ray of the colon after dye has been put into the colon (barium enema). Doing a CT scan. How is this treated? You may not need treatment for this condition if you have never developed an infection related to diverticulosis. If you have had an  infection before, treatment may include: Eating a high-fiber diet. This may include eating more fruits, vegetables, and grains. Taking a fiber supplement. Taking a live bacteria supplement (probiotic). Taking medicine to relax your colon. Taking antibiotic medicines. Follow these instructions at home: Drink 6-8 glasses of water or more each day to prevent constipation. Try not to strain when you have a bowel movement. If you have had an infection before: Eat more fiber as directed by your health care provider or your diet and nutrition specialist (dietitian). Take a fiber supplement or probiotic, if your health care provider approves. Take over-the-counter and prescription medicines only as told by your health care provider. If you were prescribed an antibiotic, take it as told by your health care provider. Do not stop taking the antibiotic even if you start to feel better. Keep all follow-up visits as told by your health care provider. This is important. Contact a health care provider if: You have pain in your abdomen. You have bloating. You have cramps. You have not had a bowel movement in 3 days. Get help right away if: Your pain gets worse. Your bloating becomes very bad. You have a fever or chills, and your symptoms suddenly get worse. You vomit. You have bowel movements that are bloody or black. You have bleeding from your rectum. Summary Diverticulosis is a condition that develops when small pouches (diverticula) form in the wall of the large intestine (colon). You may have a few pouches or many of them. This condition is most often diagnosed during an exam for other colon problems. If you have  had an infection related to diverticulosis, treatment may include increasing the fiber in your diet, taking supplements, or taking medicines. This information is not intended to replace advice given to you by your health care provider. Make sure you discuss any questions you have with  your health care provider. Document Released: 07/28/2004 Document Revised: 10/13/2017 Document Reviewed: 09/19/2016 Elsevier Patient Education  2020 [employer]. .

## 2019-09-18 ENCOUNTER — Telehealth: Payer: Self-pay | Admitting: Family Medicine

## 2019-09-18 NOTE — Telephone Encounter (Signed)
Patient advised and verbally voiced understanding.  

## 2019-09-18 NOTE — Telephone Encounter (Signed)
Chelsea Tyler called stating she had been up on all night vomiting, nauseated and diarrhea.   She contributes this to taking Cipro and Flagyl.   Feels very bloated.    Please advise if another med can be called in for her issue.   [facility] [address]

## 2019-09-18 NOTE — Telephone Encounter (Signed)
This is more likely due to metronidazole than ciprofloxacin. Suggest she stop taking metronidazole. She should also take OTC probiotic such as Electronics engineer.  If she has any worsening of abdominal pain or if unable to keep down liquids then she needs to go to ER.

## 2019-09-18 NOTE — Telephone Encounter (Signed)
Patient states that she started the medication on Saturday,09/14/2019 but symptoms did not started until yesterday evening. Please advise.

## 2019-10-03 NOTE — Progress Notes (Signed)
Subjective:   Chelsea Tyler is a 73 y.o. female who presents for Medicare Annual (Subsequent) preventive examination.    This visit is being conducted through telemedicine due to the COVID-19 pandemic. This patient has given me verbal consent via doximity to conduct this visit, patient states they are participating from their home address. Some vital signs may be absent or patient reported.    Patient identification: identified by name, DOB, and current address  Review of Systems:  N/A  Cardiac Risk Factors include: advanced age (>1355men, 70>65 women);dyslipidemia     Objective:     Vitals: There were no vitals taken for this visit.  There is no height or weight on file to calculate BMI. Unable to obtain vitals due to visit being conducted via telephonically.   Advanced Directives 10/07/2019 09/14/2019  Does Patient Have a Medical Advance Directive? Yes No  Type of Estate agentAdvance Directive Healthcare Power of [place];Living will -  Copy of Healthcare Power of Attorney in Chart? No - copy requested -    Tobacco Social History   Tobacco Use  Smoking Status Never Smoker  Smokeless Tobacco Never Used     Counseling given: Not Answered   Clinical Intake:  Pre-visit preparation completed: Yes  Pain : No/denies pain Pain Score: 0-No pain     Nutritional Risks: None Diabetes: No  How often do you need to have someone help you when you read instructions, pamphlets, or other written materials from your doctor or pharmacy?: 1 - Never  Interpreter Needed?: No  Information entered by :: [facility]erMmarkoski, LPN  Past Medical History:  Diagnosis Date  . Diverticulitis   . Hyperlipidemia    Past Surgical History:  Procedure Laterality Date  . ABDOMINAL HYSTERECTOMY    . APPENDECTOMY    . HYSTEROTOMY  1987  . TONSILLECTOMY    . TUBAL LIGATION    . UPPER GI ENDOSCOPY     10/15/02  . UVULOPLASTY     Family History  Problem Relation Age of Onset  . Stomach cancer Father   .  Healthy Daughter   . Healthy Daughter   . Breast cancer Mother   . Arthritis Mother   . Heart disease Mother   . Ovarian cancer Mother   . Healthy Sister   . Healthy Sister    Social History   Socioeconomic History  . Marital status: Married    Spouse name: Chelsea Quailsndrew Ray Bissonette  . Number of children: 3  . Years of education: Not on file  . Highest education level: GED or equivalent  Occupational History  . Occupation: retired  Engineer, productionocial Needs  . Financial resource strain: Not hard at all  . Food insecurity    Worry: Never true    Inability: Never true  . Transportation needs    Medical: No    Non-medical: No  Tobacco Use  . Smoking status: Never Smoker  . Smokeless tobacco: Never Used  Substance and Sexual Activity  . Alcohol use: No    Alcohol/week: 0.0 standard drinks  . Drug use: No  . Sexual activity: Not on file  Lifestyle  . Physical activity    Days per week: 0 days    Minutes per session: 0 min  . Stress: Not at all  Relationships  . Social Musicianconnections    Talks on phone: Patient refused    Gets together: Patient refused    Attends religious service: Patient refused    Active member of club or organization: Patient refused  Attends meetings of clubs or organizations: Patient refused    Relationship status: Patient refused  Other Topics Concern  . Not on file  Social History Narrative   Pt's son was killed by a drunk driver.    Outpatient Encounter Medications as of 10/07/2019  Medication Sig  . clonazePAM (KLONOPIN) 0.5 MG tablet TAKE 1/2 TO 1 TABLET BY MOUTH EVERY NIGHT AT BEDTIME  . meloxicam (MOBIC) 7.5 MG tablet Take 7.5 mg by mouth daily.   Marland Kitchen omeprazole (PRILOSEC) 20 MG capsule TAKE 1 CAPSULE(20 MG) BY MOUTH DAILY  . traMADol (ULTRAM) 50 MG tablet Take 1 tablet (50 mg total) by mouth every 6 (six) hours as needed.  Marland Kitchen HYDROcodone-acetaminophen (NORCO/VICODIN) 5-325 MG tablet Take 1 tablet by mouth every 6 (six) hours as needed for moderate pain.  (Patient not taking: Reported on 10/07/2019)  . [DISCONTINUED] ranitidine (ZANTAC) 150 MG tablet Take 150 mg by mouth 2 (two) times daily.   No facility-administered encounter medications on file as of 10/07/2019.     Activities of Daily Living In your present state of health, do you have any difficulty performing the following activities: 10/07/2019 09/16/2019  Hearing? N N  Vision? N N  Difficulty concentrating or making decisions? N N  Walking or climbing stairs? Y N  Comment Due to knee pains. -  Dressing or bathing? N N  Doing errands, shopping? N N  Preparing Food and eating ? N -  Using the Toilet? N -  In the past six months, have you accidently leaked urine? N -  Do you have problems with loss of bowel control? N -  Managing your Medications? N -  Managing your Finances? N -  Housekeeping or managing your Housekeeping? N -  Some recent data might be hidden    Patient Care Team: Malva Limes, MD as PCP - General (Family Medicine) Scot Jun, MD (Inactive) (Gastroenterology) Patterson Hammersmith, MD as Referring Physician (Internal Medicine)    Assessment:   This is a routine wellness examination for Chelsea Tyler.  Exercise Activities and Dietary recommendations Current Exercise Habits: The patient does not participate in regular exercise at present, Exercise limited by: orthopedic condition(s)  Goals    . DIET - EAT MORE FRUITS AND VEGETABLES     Recommend to increase fruit intake to 2 servings a day.         Fall Risk: Fall Risk  10/07/2019 09/16/2019 01/23/2018 07/25/2017  Falls in the past year? 0 0 No No  Number falls in past yr: 0 0 - -  Injury with Fall? 0 0 - -    FALL RISK PREVENTION PERTAINING TO THE HOME:  Any stairs in or around the home? Yes  If so, are there any without handrails? No   Home free of loose throw rugs in walkways, pet beds, electrical cords, etc? Yes  Adequate lighting in your home to reduce risk of falls? Yes   ASSISTIVE  DEVICES UTILIZED TO PREVENT FALLS:  Life alert? No  Use of a cane, walker or w/c? No  Grab bars in the bathroom? No  Shower chair or bench in shower? No  Elevated toilet seat or a handicapped toilet? No    TIMED UP AND GO:  Was the test performed? No .    Depression Screen PHQ 2/9 Scores 10/07/2019 09/16/2019 01/23/2018 07/25/2017  PHQ - 2 Score 0 0 0 0  PHQ- 9 Score - 0 - 0     Cognitive Function  6CIT Screen 10/07/2019  What Year? 0 points  What month? 0 points  What time? 0 points  Count back from 20 0 points  Months in reverse 0 points  Repeat phrase 0 points  Total Score 0    Immunization History  Administered Date(s) Administered  . Fluad Quad(high Dose 65+) 07/30/2019  . Influenza, High Dose Seasonal PF 07/25/2017, 07/26/2018  . Pneumococcal Conjugate-13 07/25/2017  . Pneumococcal Polysaccharide-23 08/22/2013  . Td 12/17/1999  . Zoster 08/06/2012    Qualifies for Shingles Vaccine? Yes  Zostavax completed 08/06/12. Due for Shingrix. Pt has been advised to call insurance company to determine out of pocket expense. Advised may also receive vaccine at local pharmacy or Health Dept. Verbalized acceptance and understanding.  Tdap: Although this vaccine is not a covered service during a Wellness Exam, does the patient still wish to receive this vaccine today?  No .   Flu Vaccine: Up to date  Pneumococcal Vaccine: Completed series  Screening Tests Health Maintenance  Topic Date Due  . TETANUS/TDAP  12/16/2009  . MAMMOGRAM  01/30/2020  . DEXA SCAN  02/14/2021  . COLONOSCOPY  09/15/2027  . INFLUENZA VACCINE  Completed  . Hepatitis C Screening  Completed  . PNA vac Low Risk Adult  Completed    Cancer Screenings:  Colorectal Screening: Completed 09/14/17. Repeat every 10 years.  Mammogram: Completed 02/05/18.   Bone Density: Completed 02/14/18. Results reflect OSTEOPENIA. Repeat every 3 years.   Lung Cancer Screening: (Low Dose CT Chest recommended if Age  36-80 years, 30 pack-year currently smoking OR have quit w/in 15years.) does not qualify.   Additional Screening:  Hepatitis C Screening: Up to date  Vision Screening: Recommended annual ophthalmology exams for early detection of glaucoma and other disorders of the eye.  Dental Screening: Recommended annual dental exams for proper oral hygiene  Community Resource Referral:  CRR required this visit?  No       Plan:  I have personally reviewed and addressed the Medicare Annual Wellness questionnaire and have noted the following in the patient's chart:  A. Medical and social history B. Use of alcohol, tobacco or illicit drugs  C. Current medications and supplements D. Functional ability and status E.  Nutritional status F.  Physical activity G. Advance directives H. List of other physicians I.  Hospitalizations, surgeries, and ER visits in previous 12 months J.  [place] such as hearing and vision if needed, cognitive and depression L. Referrals and appointments   In addition, I have reviewed and discussed with patient certain preventive protocols, quality metrics, and best practice recommendations. A written personalized care plan for preventive services as well as general preventive health recommendations were provided to patient. Nurse Health Advisor  Signed,    [facility] [place], [place]  16/08/9603 Nurse Health Advisor   Nurse Notes: None.

## 2019-10-07 ENCOUNTER — Other Ambulatory Visit: Payer: Self-pay

## 2019-10-07 ENCOUNTER — Ambulatory Visit (INDEPENDENT_AMBULATORY_CARE_PROVIDER_SITE_OTHER): Payer: Medicare Other

## 2019-10-07 DIAGNOSIS — Z0000 Encounter for general adult medical examination without abnormal findings: Secondary | ICD-10-CM | POA: Diagnosis not present

## 2019-10-07 NOTE — Patient Instructions (Signed)
Chelsea Tyler , Thank you for taking time to come for your Medicare Wellness Visit. I appreciate your ongoing commitment to your health goals. Please review the following plan we discussed and let me know if I can assist you in the future.   Screening recommendations/referrals: Colonoscopy: Up to date, due 09/2027 Mammogram: Up to date, due 01/2020 Bone Density: Up to date, due 02/2021 Recommended yearly ophthalmology/optometry visit for glaucoma screening and checkup Recommended yearly dental visit for hygiene and checkup  Vaccinations: Influenza vaccine: Up to date Pneumococcal vaccine: Completed series Tdap vaccine: Pt declines today.  Shingles vaccine: Pt declines today.     Advanced directives: Please bring a copy of your POA (Power of Attorney) and/or Living Will to your next appointment.   Conditions/risks identified: Recommend to increase fruit servings to two a day.   Next appointment: 02/03/20 @ 9:00 AM with Dr Caryn Section. Declined scheduling an AWV for 2021 at this time.    Preventive Care 39 Years and Older, Female Preventive care refers to lifestyle choices and visits with your health care provider that can promote health and wellness. What does preventive care include?  A yearly physical exam. This is also called an annual well check.  Dental exams once or twice a year.  Routine eye exams. Ask your health care provider how often you should have your eyes checked.  Personal lifestyle choices, including:  Daily care of your teeth and gums.  Regular physical activity.  Eating a healthy diet.  Avoiding tobacco and drug use.  Limiting alcohol use.  Practicing safe sex.  Taking low-dose aspirin every day.  Taking vitamin and mineral supplements as recommended by your health care provider. What happens during an annual well check? The services and screenings done by your health care provider during your annual well check will depend on your age, overall health,  lifestyle risk factors, and family history of disease. Counseling  Your health care provider may ask you questions about your:  Alcohol use.  Tobacco use.  Drug use.  Emotional well-being.  Home and relationship well-being.  Sexual activity.  Eating habits.  History of falls.  Memory and ability to understand (cognition).  Work and work Statistician.  Reproductive health. Screening  You may have the following tests or measurements:  Height, weight, and BMI.  Blood pressure.  Lipid and cholesterol levels. These may be checked every 5 years, or more frequently if you are over 61 years old.  Skin check.  Lung cancer screening. You may have this screening every year starting at age 15 if you have a 30-pack-year history of smoking and currently smoke or have quit within the past 15 years.  Fecal occult blood test (FOBT) of the stool. You may have this test every year starting at age 74.  Flexible sigmoidoscopy or colonoscopy. You may have a sigmoidoscopy every 5 years or a colonoscopy every 10 years starting at age 45.  Hepatitis C blood test.  Hepatitis B blood test.  Sexually transmitted disease (STD) testing.  Diabetes screening. This is done by checking your blood sugar (glucose) after you have not eaten for a while (fasting). You may have this done every 1-3 years.  Bone density scan. This is done to screen for osteoporosis. You may have this done starting at age 18.  Mammogram. This may be done every 1-2 years. Talk to your health care provider about how often you should have regular mammograms. Talk with your health care provider about your test results, treatment options,  and if necessary, the need for more tests. Vaccines  Your health care provider may recommend certain vaccines, such as:  Influenza vaccine. This is recommended every year.  Tetanus, diphtheria, and acellular pertussis (Tdap, Td) vaccine. You may need a Td booster every 10 years.  Zoster  vaccine. You may need this after age 46.  Pneumococcal 13-valent conjugate (PCV13) vaccine. One dose is recommended after age 79.  Pneumococcal polysaccharide (PPSV23) vaccine. One dose is recommended after age 4. Talk to your health care provider about which screenings and vaccines you need and how often you need them. This information is not intended to replace advice given to you by your health care provider. Make sure you discuss any questions you have with your health care provider. Document Released: 11/27/2015 Document Revised: 07/20/2016 Document Reviewed: 09/01/2015 Elsevier Interactive Patient Education  2017 [place] Prevention in the Home Falls can cause injuries. They can happen to people of all ages. There are many things you can do to make your home safe and to help prevent falls. What can I do on the outside of my home?  Regularly fix the edges of walkways and driveways and fix any cracks.  Remove anything that might make you trip as you walk through a door, such as a raised step or threshold.  Trim any bushes or trees on the path to your home.  Use bright outdoor lighting.  Clear any walking paths of anything that might make someone trip, such as rocks or tools.  Regularly check to see if handrails are loose or broken. Make sure that both sides of any steps have handrails.  Any raised decks and porches should have guardrails on the edges.  Have any leaves, snow, or ice cleared regularly.  Use sand or salt on walking paths during winter.  Clean up any spills in your garage right away. This includes oil or grease spills. What can I do in the bathroom?  Use night lights.  Install grab bars by the toilet and in the tub and shower. Do not use towel bars as grab bars.  Use non-skid mats or decals in the tub or shower.  If you need to sit down in the shower, use a plastic, non-slip stool.  Keep the floor dry. Clean up any water that spills on the  floor as soon as it happens.  Remove soap buildup in the tub or shower regularly.  Attach bath mats securely with double-sided non-slip rug tape.  Do not have throw rugs and other things on the floor that can make you trip. What can I do in the bedroom?  Use night lights.  Make sure that you have a light by your bed that is easy to reach.  Do not use any sheets or blankets that are too big for your bed. They should not hang down onto the floor.  Have a firm chair that has side arms. You can use this for support while you get dressed.  Do not have throw rugs and other things on the floor that can make you trip. What can I do in the kitchen?  Clean up any spills right away.  Avoid walking on wet floors.  Keep items that you use a lot in easy-to-reach places.  If you need to reach something above you, use a strong step stool that has a grab bar.  Keep electrical cords out of the way.  Do not use floor polish or wax that makes floors slippery. If  you must use wax, use non-skid floor wax.  Do not have throw rugs and other things on the floor that can make you trip. What can I do with my stairs?  Do not leave any items on the stairs.  Make sure that there are handrails on both sides of the stairs and use them. Fix handrails that are broken or loose. Make sure that handrails are as long as the stairways.  Check any carpeting to make sure that it is firmly attached to the stairs. Fix any carpet that is loose or worn.  Avoid having throw rugs at the top or bottom of the stairs. If you do have throw rugs, attach them to the floor with carpet tape.  Make sure that you have a light switch at the top of the stairs and the bottom of the stairs. If you do not have them, ask someone to add them for you. What else can I do to help prevent falls?  Wear shoes that:  Do not have high heels.  Have rubber bottoms.  Are comfortable and fit you well.  Are closed at the toe. Do not wear  sandals.  If you use a stepladder:  Make sure that it is fully opened. Do not climb a closed stepladder.  Make sure that both sides of the stepladder are locked into place.  Ask someone to hold it for you, if possible.  Clearly mark and make sure that you can see:  Any grab bars or handrails.  First and last steps.  Where the edge of each step is.  Use tools that help you move around (mobility aids) if they are needed. These include:  Canes.  Walkers.  Scooters.  Crutches.  Turn on the lights when you go into a dark area. Replace any light bulbs as soon as they burn out.  Set up your furniture so you have a clear path. Avoid moving your furniture around.  If any of your floors are uneven, fix them.  If there are any pets around you, be aware of where they are.  Review your medicines with your doctor. Some medicines can make you feel dizzy. This can increase your chance of falling. Ask your doctor what other things that you can do to help prevent falls. This information is not intended to replace advice given to you by your health care provider. Make sure you discuss any questions you have with your health care provider. Document Released: 08/27/2009 Document Revised: 04/07/2016 Document Reviewed: 12/05/2014 Elsevier Interactive Patient Education  2017 [employer].

## 2019-10-28 DIAGNOSIS — M533 Sacrococcygeal disorders, not elsewhere classified: Secondary | ICD-10-CM | POA: Diagnosis not present

## 2019-10-28 DIAGNOSIS — M2550 Pain in unspecified joint: Secondary | ICD-10-CM | POA: Diagnosis not present

## 2019-10-28 DIAGNOSIS — Z791 Long term (current) use of non-steroidal anti-inflammatories (NSAID): Secondary | ICD-10-CM | POA: Diagnosis not present

## 2019-10-28 DIAGNOSIS — M1711 Unilateral primary osteoarthritis, right knee: Secondary | ICD-10-CM | POA: Diagnosis not present

## 2019-10-28 DIAGNOSIS — M170 Bilateral primary osteoarthritis of knee: Secondary | ICD-10-CM | POA: Diagnosis not present

## 2019-10-28 DIAGNOSIS — M180 Bilateral primary osteoarthritis of first carpometacarpal joints: Secondary | ICD-10-CM | POA: Diagnosis not present

## 2019-10-28 DIAGNOSIS — M19042 Primary osteoarthritis, left hand: Secondary | ICD-10-CM | POA: Diagnosis not present

## 2019-10-28 DIAGNOSIS — M1712 Unilateral primary osteoarthritis, left knee: Secondary | ICD-10-CM | POA: Diagnosis not present

## 2019-10-28 DIAGNOSIS — M19041 Primary osteoarthritis, right hand: Secondary | ICD-10-CM | POA: Diagnosis not present

## 2019-10-28 DIAGNOSIS — M25552 Pain in left hip: Secondary | ICD-10-CM | POA: Diagnosis not present

## 2019-10-28 DIAGNOSIS — M159 Polyosteoarthritis, unspecified: Secondary | ICD-10-CM | POA: Diagnosis not present

## 2019-10-28 DIAGNOSIS — M2560 Stiffness of unspecified joint, not elsewhere classified: Secondary | ICD-10-CM | POA: Diagnosis not present

## 2019-11-17 ENCOUNTER — Other Ambulatory Visit: Payer: Self-pay | Admitting: Family Medicine

## 2019-11-17 DIAGNOSIS — K2100 Gastro-esophageal reflux disease with esophagitis, without bleeding: Secondary | ICD-10-CM

## 2019-12-31 ENCOUNTER — Ambulatory Visit
Admission: EM | Admit: 2019-12-31 | Discharge: 2019-12-31 | Disposition: A | Discharge status: Home / Self Care | Payer: Medicare Other | Attending: Urgent Care | Admitting: Urgent Care

## 2019-12-31 ENCOUNTER — Ambulatory Visit (INDEPENDENT_AMBULATORY_CARE_PROVIDER_SITE_OTHER): Payer: Medicare Other

## 2019-12-31 ENCOUNTER — Other Ambulatory Visit: Payer: Self-pay

## 2019-12-31 ENCOUNTER — Encounter: Payer: Self-pay | Admitting: Adult Health

## 2019-12-31 ENCOUNTER — Ambulatory Visit (INDEPENDENT_AMBULATORY_CARE_PROVIDER_SITE_OTHER): Payer: Medicare Other | Admitting: Adult Health

## 2019-12-31 ENCOUNTER — Ambulatory Visit: Payer: Self-pay | Admitting: *Deleted

## 2019-12-31 ENCOUNTER — Telehealth: Payer: Self-pay | Admitting: Nurse Practitioner

## 2019-12-31 ENCOUNTER — Encounter: Payer: Self-pay | Admitting: Emergency Medicine

## 2019-12-31 VITALS — Temp 100.5°F

## 2019-12-31 DIAGNOSIS — Z8616 Personal history of COVID-19: Secondary | ICD-10-CM

## 2019-12-31 DIAGNOSIS — J1282 Pneumonia due to coronavirus disease 2019: Secondary | ICD-10-CM | POA: Insufficient documentation

## 2019-12-31 DIAGNOSIS — J069 Acute upper respiratory infection, unspecified: Secondary | ICD-10-CM | POA: Diagnosis not present

## 2019-12-31 DIAGNOSIS — R509 Fever, unspecified: Secondary | ICD-10-CM | POA: Insufficient documentation

## 2019-12-31 DIAGNOSIS — Z20822 Contact with and (suspected) exposure to covid-19: Secondary | ICD-10-CM

## 2019-12-31 DIAGNOSIS — E871 Hypo-osmolality and hyponatremia: Secondary | ICD-10-CM

## 2019-12-31 DIAGNOSIS — U071 COVID-19: Secondary | ICD-10-CM | POA: Insufficient documentation

## 2019-12-31 DIAGNOSIS — Z8719 Personal history of other diseases of the digestive system: Secondary | ICD-10-CM | POA: Insufficient documentation

## 2019-12-31 DIAGNOSIS — R0602 Shortness of breath: Secondary | ICD-10-CM

## 2019-12-31 DIAGNOSIS — R059 Cough, unspecified: Secondary | ICD-10-CM | POA: Insufficient documentation

## 2019-12-31 DIAGNOSIS — R05 Cough: Secondary | ICD-10-CM | POA: Insufficient documentation

## 2019-12-31 HISTORY — DX: Personal history of COVID-19: Z86.16

## 2019-12-31 LAB — COMPREHENSIVE METABOLIC PANEL
ALT: 33 U/L (ref 0–44)
AST: 31 U/L (ref 15–41)
Albumin: 4.2 g/dL (ref 3.5–5.0)
Alkaline Phosphatase: 80 U/L (ref 38–126)
Anion gap: 8 (ref 5–15)
BUN: 5 mg/dL — ABNORMAL LOW (ref 8–23)
CO2: 25 mmol/L (ref 22–32)
Calcium: 8.6 mg/dL — ABNORMAL LOW (ref 8.9–10.3)
Chloride: 97 mmol/L — ABNORMAL LOW (ref 98–111)
Creatinine, Ser: 0.62 mg/dL (ref 0.44–1.00)
GFR calc Af Amer: >60 mL/min (ref >60)
GFR calc non Af Amer: >60 mL/min (ref >60)
Glucose, Bld: 131 mg/dL — ABNORMAL HIGH (ref 70–99)
Potassium: 3.7 mmol/L (ref 3.5–5.1)
Sodium: 130 mmol/L — ABNORMAL LOW (ref 135–145)
Total Bilirubin: 1.1 mg/dL (ref 0.3–1.2)
Total Protein: 7.6 g/dL (ref 6.5–8.1)

## 2019-12-31 LAB — CBC WITH DIFFERENTIAL/PLATELET
Abs Immature Granulocytes: 0.01 10*3/uL (ref 0.00–0.07)
Basophils Absolute: 0 10*3/uL (ref 0.0–0.1)
Basophils Relative: 0 %
Eosinophils Absolute: 0 10*3/uL (ref 0.0–0.5)
Eosinophils Relative: 0 %
HCT: 41.6 % (ref 36.0–46.0)
Hemoglobin: 14 g/dL (ref 12.0–15.0)
Immature Granulocytes: 0 %
Lymphocytes Relative: 21 %
Lymphs Abs: 1.2 10*3/uL (ref 0.7–4.0)
MCH: 29.3 pg (ref 26.0–34.0)
MCHC: 33.7 g/dL (ref 30.0–36.0)
MCV: 87 fL (ref 80.0–100.0)
Monocytes Absolute: 0.4 10*3/uL (ref 0.1–1.0)
Monocytes Relative: 7 %
Neutro Abs: 4 10*3/uL (ref 1.7–7.7)
Neutrophils Relative %: 72 %
Platelets: 221 10*3/uL (ref 150–400)
RBC: 4.78 MIL/uL (ref 3.87–5.11)
RDW: 14.1 % (ref 11.5–15.5)
WBC: 5.7 10*3/uL (ref 4.0–10.5)
nRBC: 0 % (ref 0.0–0.2)

## 2019-12-31 LAB — SARS CORONAVIRUS 2 AG (30 MIN TAT): SARS Coronavirus 2 Ag: POSITIVE — AB

## 2019-12-31 MED ORDER — PREDNISONE 20 MG PO TABS: 40.0000 mg | ORAL_TABLET | Freq: Every day | ORAL | 0 refills | Status: DC

## 2019-12-31 MED ORDER — HYDROCOD POLST-CPM POLST ER 10-8 MG/5ML PO SUER: 5.0000 mL | Freq: Two times a day (BID) | ORAL | 0 refills | Status: DC | PRN

## 2019-12-31 MED ORDER — ONDANSETRON 4 MG PO TBDP: 4.0000 mg | ORAL_TABLET | Freq: Three times a day (TID) | ORAL | 0 refills | Status: DC | PRN

## 2019-12-31 NOTE — Discharge Instructions (Addendum)
It was very nice seeing you today in clinic. Thank you for entrusting me with your care.   You are POSITIVE for COVID. Rest and Stay HYDRATED. Water and electrolyte containing beverages (Gatorade, Pedialyte) are best to prevent dehydration and electrolyte abnormalities. May use Tylenol and/or Ibuprofen as needed for pain/fever. Use steroids and cough medication as prescribed.   Make arrangements to follow up with your regular doctor in 1 week for re-evaluation if not improving.  If your symptoms/condition worsens, please seek follow up care either here or in the ER. Please remember, our [facility] providers are "right here with you" when you need [place].   Again, it was my pleasure to take care of you today. Thank you for choosing our clinic. I hope that you start to feel better quickly.   Quentin Mulling, MSN, APRN, FNP-C, CEN Advanced Practice Provider [facility]

## 2019-12-31 NOTE — ED Triage Notes (Signed)
Patient in today c/o cough, sob and headache x 6 days. Patient took Advil sinus and headache resolved. Patient has had fever 99.4-100.5 x 4 days. Patient started vomiting while in the exam room. Patient denies any previous emesis.

## 2019-12-31 NOTE — Patient Instructions (Signed)
Appointment scheduled at [facility]gent care for 12pm today.  Advised patient call the office or your primary care doctor for an appointment if no improvement within 72 hours or if any symptoms change or worsen at any time  Advised ER or urgent Care if after hours or on weekend. Call 911 for emergency symptoms at any time.Patinet verbalized understanding of all instructions given/reviewed and treatment plan and has no further questions or concerns at this time.

## 2019-12-31 NOTE — Telephone Encounter (Signed)
Called to Discuss with patient about Covid symptoms and the use of bamlanivimab, a monoclonal antibody infusion for those with mild to moderate Covid symptoms and at a high risk of hospitalization.     Pt is qualified for this infusion at the [facility] infusion center due to co-morbid conditions and/or a member of an at-risk group.     Patient Active Problem List   Diagnosis Date Noted  . Fever 12/31/2019  . History of diverticulitis 12/31/2019  . Cough 12/31/2019  . Upper respiratory tract infection 12/31/2019  . Hyperglycemia 01/23/2018  . Allergic rhinitis 05/06/2016  . DD (diverticular disease) 05/06/2016  . GERD (gastroesophageal reflux disease) 05/06/2016  . Hiatal hernia 05/06/2016  . Personal history of other diseases of the musculoskeletal system and connective tissue 05/06/2016  . HLD (hyperlipidemia) 05/06/2016  . Arthritis of left knee 05/06/2016  . Restless leg 05/06/2016  . Sacroiliitis, not elsewhere classified (HCC) 07/30/2013    Patient declines infusion at this time. Symptoms tier reviewed as well as criteria for ending isolation. Preventative practices reviewed. Patient verbalized understanding.    Patient advised to call back if she decides that she does want to get infusion. Callback number to the infusion center given. Patient advised to go to Urgent care or ED with severe symptoms.   Symptoms started on 12/25/19

## 2019-12-31 NOTE — Progress Notes (Signed)
Patient: Chelsea Tyler Female    DOB: [birth year]   74 y.o.   MRN: [id number] Visit Date: 12/31/2019  Today's Provider: Jairo Ben, FNP   Chief Complaint  Patient presents with  . Cough   Subjective:     Virtual Visit via Telephone Note  I connected with Chelsea Tyler on 12/31/19 at  1:20 PM EST by telephone and verified that I am speaking with the correct person using two identifiers.  Location: Patient: at home  Provider: Provider: Provider's office at  [facility], [place].      I discussed the limitations, risks, security and privacy concerns of performing an evaluation and management service by telephone and the availability of in person appointments. I also discussed with the patient that there may be a patient responsible charge related to this service. The patient expressed understanding and agreed to proceed.    I discussed the assessment and treatment plan with the patient. The patient was provided an opportunity to ask questions and all were answered. The patient agreed with the plan and demonstrated an understanding of the instructions.   The patient was advised to call back or seek an in-person evaluation if the symptoms worsen or if the condition fails to improve as anticipated.    Cough This is a new problem. The current episode started in the past 7 days. The cough is non-productive. Associated symptoms include chills, a fever, headaches, postnasal drip and rhinorrhea. Pertinent negatives include no chest pain, ear congestion, ear pain, heartburn, hemoptysis, myalgias, nasal congestion, rash, sore throat, shortness of breath, sweats, weight loss or wheezing. Nothing aggravates the symptoms. The treatment provided mild relief. Her past medical history is significant for bronchitis. There is no history of asthma, bronchiectasis, COPD, emphysema, environmental allergies or pneumonia.   Onset of symptoms since last Wednesday  12/25/2019 - she had mild cough and headache. She reports she had a headache through the weekend and took Tylenol and headache resolved. She is taking Tylenol 500 mg every 4  hours PRN. ( advise only to take every  6-8 hours)  She has had chills. She reports chest " soreness" and rib pain due to coughing. Back is also sore from coughing.  She reports highest temperature 99.4-100.5. She denies any shortness of breath. She is able to walk through her house, activity causes coughing.  She denies any dyspnea. She denies sore throat, throat swelling or any difficulty swallowing.  She had sinus congestion and pressure initially and now feels as if it is in her chest with congestion.  Denies any productive cough. Denies any hemoptysis.  She denies any body aches other than soreness from cough.  She does not have a pulse oximetry.   She denies any abdominal pain, nausea, vomiting or change in stool . Denies any bleeding.  Denies any ill contacts.  Denies any history of pneumonia.  History of bronchitis.  No recent surgeries. No history of thrombosis.   Antibiotic use: Cipro, Flagyl was seen in ER end of October for Diverticulitis on CT scan and had follow up with Dr. Sherrie Mustache on 09/16/2019 and she was doing well at that time.  Denies any abdominal pain to provider on phone. Triage note reported upper abdominal pain/ sternum pain. Patient clarified that her sternum and ribs feel " sore from all this coughing".    Patient  denies any  rash,  shortness of breath, nausea, vomiting, or diarrhea.   Allergies  Allergen  Reactions  . Clarithromycin Other (See Comments)    Other reaction(s): Abdominal pain Other reaction(s): Abdominal pain Other reaction(s): Abdominal pain  . Metronidazole Nausea And Vomiting   Patient Active Problem List   Diagnosis Date Noted  . Fever 12/31/2019  . History of diverticulitis 12/31/2019  . Cough 12/31/2019  . Upper respiratory tract infection 12/31/2019  . Hyperglycemia  01/23/2018  . Allergic rhinitis 05/06/2016  . DD (diverticular disease) 05/06/2016  . GERD (gastroesophageal reflux disease) 05/06/2016  . Hiatal hernia 05/06/2016  . Personal history of other diseases of the musculoskeletal system and connective tissue 05/06/2016  . HLD (hyperlipidemia) 05/06/2016  . Arthritis of left knee 05/06/2016  . Restless leg 05/06/2016  . Sacroiliitis, not elsewhere classified (HCC) 07/30/2013     Current Outpatient Medications:  .  celecoxib (CELEBREX) 100 MG capsule, Take 100 mg by mouth 2 (two) times daily., Disp: , Rfl:  .  clonazePAM (KLONOPIN) 0.5 MG tablet, TAKE 1/2 TO 1 TABLET BY MOUTH EVERY NIGHT AT BEDTIME, Disp: 30 tablet, Rfl: 5 .  omeprazole (PRILOSEC) 20 MG capsule, TAKE 1 CAPSULE(20 MG) BY MOUTH DAILY, Disp: 30 capsule, Rfl: 12 .  HYDROcodone-acetaminophen (NORCO/VICODIN) 5-325 MG tablet, Take 1 tablet by mouth every 6 (six) hours as needed for moderate pain. (Patient not taking: Reported on 10/07/2019), Disp: 8 tablet, Rfl: 0 .  meloxicam (MOBIC) 7.5 MG tablet, Take 7.5 mg by mouth daily. , Disp: , Rfl:  .  traMADol (ULTRAM) 50 MG tablet, Take 1 tablet (50 mg total) by mouth every 6 (six) hours as needed. (Patient not taking: Reported on 12/31/2019), Disp: 120 tablet, Rfl: 0  Review of Systems  Constitutional: Positive for chills, fatigue and fever. Negative for activity change, appetite change, diaphoresis, unexpected weight change and weight loss.  HENT: Positive for congestion, postnasal drip, rhinorrhea and sinus pressure. Negative for dental problem, drooling, ear discharge, ear pain, facial swelling, hearing loss, mouth sores, nosebleeds, sinus pain, sneezing, sore throat, tinnitus, trouble swallowing and voice change.   Eyes: Negative.   Respiratory: Positive for cough and chest tightness. Negative for apnea, hemoptysis, choking, shortness of breath, wheezing and stridor.   Cardiovascular: Negative for chest pain.  Gastrointestinal: Negative.   Negative for heartburn.  Genitourinary: Negative.   Musculoskeletal: Positive for arthralgias and back pain. Negative for gait problem, joint swelling, myalgias, neck pain and neck stiffness.  Skin: Negative for color change, pallor and rash.  Allergic/Immunologic: Negative for environmental allergies.  Neurological: Positive for headaches.  Psychiatric/Behavioral: Negative.     Social History   Tobacco Use  . Smoking status: Never Smoker  . Smokeless tobacco: Never Used  Substance Use Topics  . Alcohol use: No    Alcohol/week: 0.0 standard drinks      Objective:   Temp (!) 100.5 F (38.1 C) (Oral)  Vitals:   12/31/19 1045  Temp: (!) 100.5 F (38.1 C)  TempSrc: Oral  There is no height or weight on file to calculate BMI. No video capabilities available and no other vital signs avalible.   Physical Exam   Patient is alert and oriented and responsive to questions Engages in conversation with provider. Speaks in full sentences with occasional pauses while speaking, no significant shortness of breath or stridor on phone.  She does sound like she pauses to catch her breath at times.  She denies any distress.  Audible congested cough while speaking on phone present intermittently.   No results found for any visits on 12/31/19.  Assessment & Plan   Upper respiratory tract infection, unspecified type  Cough  History of diverticulitis  Fever, unspecified fever cause  She has been taking Tylenol 1,000 mg every 4 hours she reports pretty consistently.. Still febrile 100.5 this morning. Patient does not have a pulse oximetry available . Given age and symptoms, would prefer patient to have a in person evaluation, she declined ER this morning for triage, however she does agree to go to the [facility] for evaluation and hands on exam since we are unable to see her in person at this office due to Covid 19 policy/ protocol. She does have a recent history of  diverticulitis as well. Needs Chest X- Ray, CBC. CMP, covid testing and vital signs/ hands on assessment/ further work up as needed rule out pneumonia or other etiology.  Patient has appointment scheduled at 12 pm today with [facility].  Patient verbalized understanding of all instructions given and denies any further questions at this time.  Advised on correct way to take Tylenol per package instructions only no every 4 hours and reasons/ side effects given.  She has someone to drive her to urgent care.   The entirety of the information documented in the History of Present Illness, Review of Systems and Physical Exam were personally obtained by me. Portions of this information were initially documented by the  Certified Medical Assistant whose name is documented in [place] and reviewed by me for thoroughness and accuracy.  I have personally performed the exam and reviewed the chart and it is accurate to the best of my knowledge.  Haematologist has been used and any errors in dictation or transcription are unintentional.  Kelby Aline. Flinchum FNP-C  [facility]  I provided 20 minutes of non-face-to-face time during this encounter. Marcille Buffy, [facility]

## 2019-12-31 NOTE — ED Provider Notes (Signed)
[place], [place]   Name: Chelsea Tyler DOB: 21-Oct-1946 MRN: [id number] CSN: [id number] PCP: Birdie Sons, MD  Arrival date and time:  12/31/19 1150  Chief Complaint:  Cough, Headache, and Shortness of Breath   NOTE: Prior to seeing the patient today, I have reviewed the triage nursing documentation and vital signs. Clinical staff has updated patient's PMH/PSHx, current medication list, and drug allergies/intolerances to ensure comprehensive history available to assist in medical decision making.   History:   HPI: Chelsea Tyler is a 74 y.o. female who presents today with complaints of fatigue, cough, congestion, paranasal sinus tenderness, and a generalized headache that started approximately 6 days ago. Symptoms acutely worsened 4 days ago. Patient has had fevers as high as 100.5. Cough has been non-productive. She notes that it is worse at night and when supine. Cough precipitates episodes of nausea and post-tussive emesis. She notes generalized "soreness" in her chest due to her forceful coughing; exacerbated by deep inspiration. Patient has had shortness of breath and mild wheezing. She denies that she has experienced any diarrhea or abdominal pain. She reports a decreased appetite overall, however advises that she is making efforts to ensure adequate hydration. Patient denies any perceived alterations to her sense of taste or smell. Patient denies being in close contact with anyone known to be ill; no one else is her home has experienced a similar symptom constellation. She has never been tested for SARS-CoV-2 (novel coronavirus) in the past per her report. Patient has been vaccinated for influenza this season. In efforts to conservatively manage her symptoms at home, the patient notes that she has used 1 gram of APAP every 4 hours for the last few days, which has not significantly helped to improve her symptoms. Patient participated in a tele-health visit earlier today with her PCP  (Flinchum, [place]) and was subsequently advised to present to urgent care for a F2F evaluation.   Past Medical History:  Diagnosis Date  . Diverticulitis   . Hyperlipidemia     Past Surgical History:  Procedure Laterality Date  . ABDOMINAL HYSTERECTOMY    . APPENDECTOMY    . HYSTEROTOMY  1987  . TONSILLECTOMY    . TUBAL LIGATION    . UPPER GI ENDOSCOPY     10/15/02  . UVULOPLASTY      Family History  Problem Relation Age of Onset  . Stomach cancer Father   . Healthy Daughter   . Healthy Daughter   . Breast cancer Mother   . Arthritis Mother   . Heart disease Mother   . Ovarian cancer Mother   . Healthy Sister   . Healthy Sister     Social History   Tobacco Use  . Smoking status: Never Smoker  . Smokeless tobacco: Never Used  Substance Use Topics  . Alcohol use: No    Alcohol/week: 0.0 standard drinks  . Drug use: No    Patient Active Problem List   Diagnosis Date Noted  . Fever 12/31/2019  . History of diverticulitis 12/31/2019  . Cough 12/31/2019  . Upper respiratory tract infection 12/31/2019  . Hyperglycemia 01/23/2018  . Allergic rhinitis 05/06/2016  . DD (diverticular disease) 05/06/2016  . GERD (gastroesophageal reflux disease) 05/06/2016  . Hiatal hernia 05/06/2016  . Personal history of other diseases of the musculoskeletal system and connective tissue 05/06/2016  . HLD (hyperlipidemia) 05/06/2016  . Arthritis of left knee 05/06/2016  . Restless leg 05/06/2016  . Sacroiliitis, not elsewhere classified ([place]) 07/30/2013  Home Medications:    Current Meds  Medication Sig  . celecoxib (CELEBREX) 100 MG capsule Take 100 mg by mouth 2 (two) times daily.  . clonazePAM (KLONOPIN) 0.5 MG tablet TAKE 1/2 TO 1 TABLET BY MOUTH EVERY NIGHT AT BEDTIME  . omeprazole (PRILOSEC) 20 MG capsule TAKE 1 CAPSULE(20 MG) BY MOUTH DAILY    Allergies:   Clarithromycin and Metronidazole  Review of Systems (ROS): Review of Systems  Constitutional: Positive for  appetite change (decreased), fatigue and fever (Tmax 100.5).  HENT: Positive for congestion and sinus pain. Negative for ear pain, postnasal drip, rhinorrhea, sinus pressure, sneezing and sore throat.   Eyes: Negative for pain, discharge and redness.  Respiratory: Positive for cough, chest tightness, shortness of breath and wheezing.   Cardiovascular: Positive for chest pain (pleuritic). Negative for palpitations.  Gastrointestinal: Positive for nausea and vomiting (post-tussive). Negative for abdominal pain and diarrhea.  Musculoskeletal: Negative for arthralgias, back pain, myalgias and neck pain.  Skin: Negative for color change, pallor and rash.  Neurological: Positive for weakness (generalized) and headaches. Negative for dizziness and syncope.  Hematological: Negative for adenopathy.  Psychiatric/Behavioral: Positive for sleep disturbance (2/2 cough).     Vital Signs: Today's Vitals   12/31/19 1227 12/31/19 1228 12/31/19 1231 12/31/19 1423  BP:   (!) 146/94   Pulse:   (!) 110   Resp:   18   Temp:   98.7 F (37.1 C)   TempSrc:   Oral   SpO2:   96%   Weight:  200 lb (90.7 kg)    Height:  _0  (1.6 m)    PainSc: 5    5     Physical Exam: Physical Exam  Constitutional: She is oriented to person, place, and time and well-developed, well-nourished, and in no distress.  Acutely ill appearing; fatigued/listless.  HENT:  Head: Normocephalic and atraumatic.  Nose: Rhinorrhea and sinus tenderness present. No mucosal edema.  Mouth/Throat: Uvula is midline and mucous membranes are normal. Posterior oropharyngeal erythema present. No oropharyngeal exudate or posterior oropharyngeal edema.  Eyes: Pupils are equal, round, and reactive to light.  Cardiovascular: Regular rhythm, normal heart sounds and intact distal pulses. Tachycardia present.  Pulmonary/Chest: Effort normal. She has decreased breath sounds in the right upper field and the right lower field. She has wheezes (scattered  expiratory). She has rhonchi in the right upper field.  Significant cough noted in clinic. Moderate SOB. Noincreased WOB. No distress. Difficulties speaking in complete sentences due to forceful coughing. SPO2 96% on RA.  Abdominal: Soft. Normal appearance and bowel sounds are normal. She exhibits no distension. There is no abdominal tenderness.  Neurological: She is alert and oriented to person, place, and time. She has normal sensation, normal strength and normal reflexes. Gait normal.  Skin: Skin is warm and dry. No rash noted. She is not diaphoretic.  Psychiatric: Mood, memory, affect and judgment normal.  Nursing note and vitals reviewed.   Urgent Care Treatments / Results:   Orders Placed This Encounter  Procedures  . SARS Coronavirus 2 Ag (30 min TAT) - Nasal Swab (BD Veritor Kit)  . DG Chest 2 View  . CBC with Differential  . Comprehensive metabolic panel    LABS: PLEASE NOTE: all labs that were ordered this encounter are listed, however only abnormal results are displayed. Labs Reviewed  SARS CORONAVIRUS 2 AG (30 MIN TAT) - Abnormal; Notable for the following components:      Result Value   SARS Coronavirus 2  Ag POSITIVE (*)    All other components within normal limits  COMPREHENSIVE METABOLIC PANEL - Abnormal; Notable for the following components:   Sodium 130 (*)    Chloride 97 (*)    Glucose, Bld 131 (*)    BUN 5 (*)    Calcium 8.6 (*)    All other components within normal limits  CBC WITH DIFFERENTIAL/PLATELET    EKG: -None  RADIOLOGY: DG Chest 2 View  Result Date: 12/31/2019 CLINICAL DATA:  Shortness of breath and fevers EXAM: CHEST - 2 VIEW COMPARISON:  01/26/2010 FINDINGS: Cardiac shadows within normal limits. Lungs are well aerated bilaterally. Mild patchy infiltrative changes seen in the right upper lobe. No sizable effusion is noted. No acute bony abnormality is seen. IMPRESSION: Patchy opacities in the right upper lobe. Electronically Signed   By: Inez Catalina M.D.   On: 12/31/2019 13:37    PROCEDURES: Procedures  MEDICATIONS RECEIVED THIS VISIT: Medications - No data to display  PERTINENT CLINICAL COURSE NOTES/UPDATES:   Initial Impression / Assessment and Plan / Urgent Care Course:  Pertinent labs & imaging results that were available during my care of the patient were personally reviewed by me and considered in my medical decision making (see lab/imaging section of note for values and interpretations).  Chelsea Tyler is a 74 y.o. female who presents to [facility] today with complaints of Cough, Headache, and Shortness of Breath  Patient acutely ill appearing (non-toxic) appearing in clinic today. She does not appear to be in any acute distress. Patient with worsening symptoms x 1 week. She participated earlier today in a tele-health visit and was sent to urgent care for further evaluation. Will proceed as follows:   Presenting symptoms (see HPI) and exam as documented above. She presents with symptoms associated with SARS-CoV-2 (novel coronavirus). Discussed typical symptom constellation. Reviewed potential for infection and need for testing. Patient amenable to being tested. Rapid SARS-CoV-2 Ag swab collected by certified clinical staff; results POSITIVE.  Patient has multiple co-morbidities that increase her SARS-CoV-2 morbidity and mortality risk including age and body mass index of 35.43 kg/m.  Her co-morbidities should qualify her for treatment with the monoclonal antibody (bamlanivimab) infusion. Telephone referral made to the outpatient COVID response team prior to patient discharge. Team will determine eligibility and contact the patient to discuss if she is deemed to be eligible for the infusion treatment.   Radiographs of the chest reveal patchy opacities in the patient's RIGHT upper lung lobe consistent with SARS-CoV-2 pneumonia.   Labs ordered and reviewed  WBC 5.7 ([place] 4000). Hemoglobin 14, hematocrit 41.6, MCV  87, MCH 29.3, and platelets 221.    Na 130, K+ 3.7, glucose 131, BUN 5, and creatinine 0.62 mg/dL. Estimated Creatinine Clearance: 68.9 mL/min (by C-G formula based on SCr of 0.62 mg/dL).  Results reviewed in detail with patient. Additionally, I have communicated with PCP (Flinchum, FNP) via [facility] chat to make her aware of (+) SARS-CoV-2 testing and associated pneumonia demonstrated on today's radiographs of the chest. Patient verbalizes understanding of her diagnosis.  I discussed with her that her symptoms are viral in nature, thus antibiotics would not offer her any relief or improve her symptoms any faster than conservative symptomatic management. Discussed supportive care measures at home during acute phase of illness. Patient to rest as much as possible. She was encouraged to ensure adequate hydration (water and ORS) to prevent dehydration and further electrolyte derangements. Prescription for supply of ondansetron sent in to help  with patient's nausea. Patient may use APAP and/or IBU on an as needed basis for pain/fever. Prescription for systemic steroid course sent in to help with patient chest tightness, wheezing, cough, and pleuritic chest pain. Cough is significant and preventing sleep. Will send in a supply of Tussionex for PRN use. She was educated on the indications and associated side effects of this medication.  Discussed follow up with primary care physician in 1 week, or sooner if needed, for re-evaluation if not improving. I have reviewed the follow up and strict return precautions for any new or worsening symptoms. Patient is aware of symptoms that would be deemed urgent/emergent, and would thus require further evaluation either here or in the emergency department. At the time of discharge, she verbalized understanding and consent with the discharge plan as it was reviewed with her. All questions were fielded by provider and/or clinic staff prior to patient discharge.    Final Clinical  Impressions / Urgent Care Diagnoses:   Final diagnoses:  [id number]  Pneumonia due to COVID-19 virus  Hyponatremia  Encounter for laboratory testing for COVID-19 virus    New Prescriptions:  [place] Controlled Substance Registry consulted? Yes, I have consulted the [place] Controlled Substances Registry for this patient, and feel the risk/benefit ratio today is favorable for proceeding with this prescription for a controlled substance.  . Discussed use of controlled substance medication to treat her acute symptoms.  o Reviewed [place] STOP Act regulations  o Clinic does not refill controlled substances over the phone without face to face evaluation.  . Safety precautions reviewed.  o Medications should not be sold or taken with alcohol.  o Avoid use while working, driving, or operating heavy machinery.  o Side effects associated with the use of this particular medication reviewed. - Patient understands that this medication can cause CNS depression, increase her risk of falls, and even lead to overdose that may result in death, if used outside of the parameters that she and I discussed.  With all of this in mind, she knowingly accepts the risks and responsibilities associated with intended course of treatment, and elects to responsibly proceed as discussed.  Meds ordered this encounter  Medications  . chlorpheniramine-HYDROcodone (TUSSIONEX PENNKINETIC ER) 10-8 MG/5ML SUER    Sig: Take 5 mLs by mouth every 12 (twelve) hours as needed for cough. Will causes drowsiness; NO DRIVING.    Dispense:  70 mL    Refill:  0  . predniSONE (DELTASONE) 20 MG tablet    Sig: Take 2 tablets (40 mg total) by mouth daily.    Dispense:  10 tablet    Refill:  0  . ondansetron (ZOFRAN-ODT) 4 MG disintegrating tablet    Sig: Take 1 tablet (4 mg total) by mouth every 8 (eight) hours as needed.    Dispense:  15 tablet    Refill:  0    Recommended Follow up Care:  Patient encouraged to follow up with the following  provider within the specified time frame, or sooner as dictated by the severity of her symptoms. As always, she was instructed that for any urgent/emergent care needs, she should seek care either here or in the emergency department for more immediate evaluation.  Follow-up Information    Fisher, Kirstie Peri, MD In 1 week.   Specialty: Family Medicine Why: General reassessment of symptoms if not improving Contact information: [address] [place] [address] [phone]        Go to  [place]  [place].   Specialty: Emergency Medicine Why: If worsening. Contact information: [place] [id number] [address] [place] [phone]        NOTE: This note was prepared using Dragon dictation software along with smaller phrase technology. Despite my best ability to proofread, there is the potential that transcriptional errors may still occur from this process, and are completely unintentional.    Karen Kitchens, NP 01/01/20 [phone]

## 2019-12-31 NOTE — Telephone Encounter (Signed)
Pt called with complaints of chest pain that started 12/28/19 and associated with cough, that started 12/25/19 but has worsened; the pt thinks she had a sinus infection; the pt says the pain is located at the top of her stomach and breatbone; her temp has been 99.4-1.00.4; she says the area and she has constant ache; her pain is rated 8 out of 10; the pain radiates to the middle of her back; recommendations made per nurse triage protocol; she refused and states she will not go; algorithm completed; pt offered and accepted virtual appt with Aaron Edelman 12/31/19 at 1320 because her PCP, Dr Sherrie Mustache is not available; spoke with [facility] regarding appt; will route to office for notification.  Reason for Disposition . Patient sounds very sick or weak to the triager  Answer Assessment - Initial Assessment Questions 1. ONSET: "When did the cough begin?"      12/25/19 2. SEVERITY: "How bad is the cough today?"      moderate 3. RESPIRATORY DISTRESS: "Describe your breathing."     No  4. FEVER: "Do you have a fever?" If so, ask: "What is your temperature, how was it measured, and when did it start?"    99.4-100.4 5. HEMOPTYSIS: "Are you coughing up any blood?" If so ask: "How much?" (flecks, streaks, tablespoons, etc.)     no 6. TREATMENT: "What have you done so far to treat the cough?" (e.g., meds, fluids, humidifier)     nothing 7. CARDIAC HISTORY: "Do you have any history of heart disease?" (e.g., heart attack, congestive heart failure)      no 8. LUNG HISTORY: "Do you have any history of lung disease?"  (e.g., pulmonary embolus, asthma, emphysema)     Hx bronchitis 9. PE RISK FACTORS: "Do you have a history of blood clots?" (or: recent major surgery, recent prolonged travel, bedridden)    no 10. OTHER SYMPTOMS: "Do you have any other symptoms? (e.g., runny nose, wheezing, chest pain)       Chest pain associated with cough 11. PREGNANCY: "Is there any chance you are pregnant?" "When was your last  menstrual period?"       no 12. TRAVEL: "Have you traveled out of the country in the last month?" (e.g., travel history, exposures)      no  Protocols used: COUGH - ACUTE NON-PRODUCTIVE-A-AH

## 2020-01-01 ENCOUNTER — Other Ambulatory Visit: Payer: Self-pay | Admitting: Internal Medicine

## 2020-01-01 ENCOUNTER — Encounter: Payer: Self-pay | Admitting: Urgent Care

## 2020-01-01 ENCOUNTER — Ambulatory Visit (HOSPITAL_COMMUNITY)
Admission: RE | Admit: 2020-01-01 | Discharge: 2020-01-01 | Disposition: A | Discharge status: Home / Self Care | Payer: Medicare Other | Source: Ambulatory Visit | Attending: Pulmonary Disease | Admitting: Pulmonary Disease

## 2020-01-01 DIAGNOSIS — U071 COVID-19: Secondary | ICD-10-CM | POA: Diagnosis present

## 2020-01-01 DIAGNOSIS — Z23 Encounter for immunization: Secondary | ICD-10-CM | POA: Insufficient documentation

## 2020-01-01 MED ORDER — DIPHENHYDRAMINE HCL 50 MG/ML IJ SOLN: 50.0000 mg | Freq: Once | INTRAMUSCULAR | Status: DC | PRN

## 2020-01-01 MED ORDER — EPINEPHRINE 0.3 MG/0.3ML IJ SOAJ: 0.3000 mg | Freq: Once | INTRAMUSCULAR | Status: DC | PRN

## 2020-01-01 MED ORDER — METHYLPREDNISOLONE SODIUM SUCC 125 MG IJ SOLR: 125.0000 mg | Freq: Once | INTRAMUSCULAR | Status: DC | PRN

## 2020-01-01 MED ORDER — SODIUM CHLORIDE 0.9 % IV SOLN
700.0000 mg | Freq: Once | INTRAVENOUS | Status: AC
  Administered 2020-01-01: 12:00:00 700 mg via INTRAVENOUS
  Filled 2020-01-01: qty 20

## 2020-01-01 MED ORDER — FAMOTIDINE IN NACL 20-0.9 MG/50ML-% IV SOLN: 20.0000 mg | Freq: Once | INTRAVENOUS | Status: DC | PRN

## 2020-01-01 MED ORDER — SODIUM CHLORIDE 0.9 % IV SOLN
INTRAVENOUS | Status: DC | PRN
  Administered 2020-01-01: 250 mL via INTRAVENOUS

## 2020-01-01 MED ORDER — ALBUTEROL SULFATE HFA 108 (90 BASE) MCG/ACT IN AERS: 2.0000 | INHALATION_SPRAY | Freq: Once | RESPIRATORY_TRACT | Status: DC | PRN

## 2020-01-01 NOTE — Progress Notes (Signed)
  Diagnosis: COVID-19  Physician: Dr. Wright  Procedure: Covid Infusion Clinic Med: bamlanivimab infusion - Provided patient with bamlanimivab fact sheet for patients, parents and caregivers prior to infusion.  Complications: No immediate complications noted.  Discharge: Discharged home   Bernis Stecher 01/01/2020   

## 2020-01-01 NOTE — Discharge Instructions (Signed)

## 2020-01-01 NOTE — Progress Notes (Unsigned)
  I connected by phone with Darrin Nipper on 01/01/2020 at 8:23 AM to discuss the potential use of an new treatment for mild to moderate COVID-19 viral infection in non-hospitalized patients.  This patient is a 74 y.o. female that meets the FDA criteria for Emergency Use Authorization of bamlanivimab or casirivimab\imdevimab.  Has a (+) direct SARS-CoV-2 viral test result  Has mild or moderate COVID-19   Is ? 72 years of age and weighs ? 40 kg  Is NOT hospitalized due to COVID-19  Is NOT requiring oxygen therapy or requiring an increase in baseline oxygen flow rate due to COVID-19  Is within 10 days of symptom onset  Has at least one of the high risk factor(s) for progression to severe COVID-19 and/or hospitalization as defined in EUA.  Specific high risk criteria : >/= 72 yo   I have spoken and communicated the following to the patient or parent/caregiver:  1. FDA has authorized the emergency use of bamlanivimab and casirivimab\imdevimab for the treatment of mild to moderate COVID-19 in adults and pediatric patients with positive results of direct SARS-CoV-2 viral testing who are 41 years of age and older weighing at least 40 kg, and who are at high risk for progressing to severe COVID-19 and/or hospitalization.  2. The significant known and potential risks and benefits of bamlanivimab and casirivimab\imdevimab, and the extent to which such potential risks and benefits are unknown.  3. Information on available alternative treatments and the risks and benefits of those alternatives, including clinical trials.  4. Patients treated with bamlanivimab and casirivimab\imdevimab should continue to self-isolate and use infection control measures (e.g., wear mask, isolate, social distance, avoid sharing personal items, clean and disinfect "high touch" surfaces, and frequent handwashing) according to CDC guidelines.   5. The patient or parent/caregiver has the option to accept or refuse  bamlanivimab or casirivimab\imdevimab .  After reviewing this information with the patient, The patient agreed to proceed with receiving the bamlanimivab infusion and will be provided a copy of the Fact sheet prior to receiving the infusion..  Infusion scheduled for today at 1230.   Cyndee Brightly, NP-C [facility]  pgr [phone]

## 2020-01-06 ENCOUNTER — Encounter: Payer: Self-pay | Admitting: Family Medicine

## 2020-01-06 MED ORDER — AZITHROMYCIN 250 MG PO TABS: ORAL_TABLET | ORAL | 0 refills | Status: AC

## 2020-01-07 NOTE — Telephone Encounter (Signed)
r 

## 2020-01-28 ENCOUNTER — Ambulatory Visit: Payer: Self-pay | Admitting: Family Medicine

## 2020-02-03 ENCOUNTER — Encounter: Payer: Medicare Other | Admitting: Family Medicine

## 2020-02-10 ENCOUNTER — Encounter: Payer: Medicare Other | Admitting: Family Medicine

## 2020-03-13 ENCOUNTER — Other Ambulatory Visit: Payer: Self-pay | Admitting: Family Medicine

## 2020-03-13 DIAGNOSIS — G2581 Restless legs syndrome: Secondary | ICD-10-CM

## 2020-03-13 NOTE — Telephone Encounter (Signed)
Requested medication (s) are due for refill today - yes  Requested medication (s) are on the active medication list -yes  Future visit scheduled -no  Last refill: 01/25/20  Notes to clinic: Request for non delegated Rx  Requested Prescriptions  Pending Prescriptions Disp Refills   clonazePAM (KLONOPIN) 0.5 MG tablet [Pharmacy Med Name: CLONAZEPAM 0.5MG  TABLETS] 30 tablet     Sig: TAKE 1/2 TO 1 TABLET BY MOUTH EVERY NIGHT AT BEDTIME      Not Delegated - Psychiatry:  Anxiolytics/Hypnotics Failed - 03/13/2020 12:24 PM      Failed - This refill cannot be delegated      Failed - Urine Drug Screen completed in last 360 days.      Passed - Valid encounter within last 6 months    Recent Outpatient Visits           2 months ago Upper respiratory tract infection, unspecified type   [facility] Flinchum, Eula Fried, FNP   5 months ago Diverticulitis   [facility] Malva Limes, MD   1 year ago Hyperglycemia   [facility] Malva Limes, MD   2 years ago Medicare annual wellness visit, subsequent   [facility] [place], Marzella Schlein, MD   2 years ago Restless leg   [facility] Malva Limes, MD                  Requested Prescriptions  Pending Prescriptions Disp Refills   clonazePAM (KLONOPIN) 0.5 MG tablet [Pharmacy Med Name: CLONAZEPAM 0.5MG  TABLETS] 30 tablet     Sig: TAKE 1/2 TO 1 TABLET BY MOUTH EVERY NIGHT AT BEDTIME      Not Delegated - Psychiatry:  Anxiolytics/Hypnotics Failed - 03/13/2020 12:24 PM      Failed - This refill cannot be delegated      Failed - Urine Drug Screen completed in last 360 days.      Passed - Valid encounter within last 6 months    Recent Outpatient Visits           2 months ago Upper respiratory tract infection, unspecified type   [facility] Flinchum, Eula Fried, FNP   5 months ago Diverticulitis   [facility] Malva Limes, MD   1 year ago Hyperglycemia   [facility] Malva Limes, MD   2 years ago Medicare annual wellness visit, subsequent   [facility] [place], Marzella Schlein, MD   2 years ago Restless leg   [facility] Malva Limes, MD

## 2020-03-17 DIAGNOSIS — Z0100 Encounter for examination of eyes and vision without abnormal findings: Secondary | ICD-10-CM | POA: Diagnosis not present

## 2020-07-23 ENCOUNTER — Telehealth: Payer: Self-pay

## 2020-07-23 ENCOUNTER — Ambulatory Visit: Payer: Self-pay | Admitting: Family Medicine

## 2020-07-23 NOTE — Telephone Encounter (Signed)
Copied from CRM [id number]. Topic: General - Other >> Jul 23, 2020 10:13 AM Chelsea Tyler wrote: Reason for CRM: Patient called to schedule an appointment with Dr Sherrie Mustache stated that she is having pain in her lower abdomen since 07/20/20 and is not sure what to do. Asking for Dr Esmond Camper help please. Ph# [phone]

## 2020-07-23 NOTE — Telephone Encounter (Signed)
Patient called office back and rated pain now at 8 and states that she is running high fever, patient does have a PMH of diverticulitis. I advised since we have no openings today or tomorrow that patient got to the nearest Emergency Room now. Patient verbalized understanding. KW

## 2020-07-23 NOTE — Telephone Encounter (Signed)
Spoke with patient on the phone who reports that for the past 3 days she has had LLQ pain that she describes as dull an sharp, she reports pain when passing a bowel movement. Patient denies fever, constipation, diarrhea, blood in stool or hematuria. Patient reports that she is having lower back pain and reports that she had a temperature of 99.8 last night but since temperature has returned back to normal. All providers schedules are booked today and tomorrow, you do have one open slot available tomorrow for hospital visit, is it okay to take this appt time at 11AM? Or do you feel it is more appropriate for patient to seek medical treatment at urgent care facility? Please advise. KW

## 2020-07-23 NOTE — Telephone Encounter (Signed)
Yes agree with ER for worsening symptoms. Needs urgent imaging to see if diverticulitis and make sure not ruptured.

## 2020-07-23 NOTE — Telephone Encounter (Signed)
Ret'd. Call to pt.  Reported onset of left lower quad. Abd. Pain on Monday.  Stated the onset was gradual.  Reported the pain increases to 8/10 at times.  Reported noting smaller and more narrow stools, and increase in discomfort with BM.  C/o fever / chills. Denied nausea/ vomiting.  Advised with current pain level, and hx of diverticulitis, she will need to be seen.  Pt. Stated "I don't want to go to the ER."    Call placed to Flow Coordinator.  Stated she spoke with pt. Previously today, and sent high priority message to the PA in office.  Per FC, there are no available openings in the office today or tomorrow, and pt. Should go to UC or ER.  Spoke with patient.  Advised of the above.  Encouraged her to go to the ER, as there would be more resources for diagnosing her abdominal pain.  Pt. Verb. Understanding.    Reason for Disposition . [1] SEVERE pain AND [2] age > 60 years  Answer Assessment - Initial Assessment Questions 1. LOCATION: "Where does it hurt?"      Pain in left lower abdomen  2. RADIATION: "Does the pain shoot anywhere else?" (e.g., chest, back)     Non-radiating  3. ONSET: "When did the pain begin?" (e.g., minutes, hours or days ago)      Monday, 9/6 4. SUDDEN: "Gradual or sudden onset?"     gradual 5. PATTERN "Does the pain come and go, or is it constant?"    - If constant: "Is it getting better, staying the same, or worsening?"      (Note: Constant means the pain never goes away completely; most serious pain is constant and it progresses)     - If intermittent: "How long does it last?" "Do you have pain now?"     (Note: Intermittent means the pain goes away completely between bouts)     Comes and goes with change in severity 6. SEVERITY: "How bad is the pain?"  (e.g., Scale 1-10; mild, moderate, or severe)   - MILD (1-3): doesn't interfere with normal activities, abdomen soft and not tender to touch    - MODERATE (4-7): interferes with normal activities or awakens  from sleep, tender to touch    - SEVERE (8-10): excruciating pain, doubled over, unable to do any normal activities      8/10 at times 7. RECURRENT SYMPTOM: "Have you ever had this type of stomach pain before?" If Yes, ask: "When was the last time?" and "What happened that time?"      Had diverticulitis in past 8. CAUSE: "What do you think is causing the stomach pain?"     Poss diverticulitis 9. RELIEVING/AGGRAVATING FACTORS: "What makes it better or worse?" (e.g., movement, antacids, bowel movement)     Gets worse with trying to have BM's; worse with standing up.  10. OTHER SYMPTOMS: "Has there been any vomiting, diarrhea, constipation, or urine problems?"      Having small BM's, more narrow; some fever/ chills  11. PREGNANCY: "Is there any chance you are pregnant?" "When was your last menstrual period?"       n/a  Protocols used: ABDOMINAL PAIN - [facility]  Message from Jodie Echevaria sent at 07/23/2020 10:17 AM EDT  Patient called to say she is having pain in her lower abdomen since 07/20/20 and is not sure what to do. Asking for a nurse to help please call Ph# [phone]

## 2020-07-23 NOTE — Telephone Encounter (Signed)
Patient advised to go to ER by CMA Nicholos Johns. Patient was in agreement. See separate phone message.

## 2020-09-20 ENCOUNTER — Other Ambulatory Visit: Payer: Self-pay | Admitting: Family Medicine

## 2020-09-20 DIAGNOSIS — G2581 Restless legs syndrome: Secondary | ICD-10-CM

## 2020-09-21 NOTE — Telephone Encounter (Signed)
Requested medication (s) are due for refill today: Yes  Requested medication (s) are on the active medication list: Yes  Last refill:  03/03/20  Future visit scheduled: No  Notes to clinic:  Pt. Needs appointment. See request.    Requested Prescriptions  Pending Prescriptions Disp Refills   clonazePAM (KLONOPIN) 0.5 MG tablet [Pharmacy Med Name: CLONAZEPAM 0.5MG  TABLETS] 30 tablet     Sig: TAKE 1/2 TO 1 TABLET BY MOUTH EVERY NIGHT AT BEDTIME      Not Delegated - Psychiatry:  Anxiolytics/Hypnotics Failed - 09/20/2020  9:18 PM      Failed - This refill cannot be delegated      Failed - Urine Drug Screen completed in last 360 days      Failed - Valid encounter within last 6 months    Recent Outpatient Visits           8 months ago Upper respiratory tract infection, unspecified type   [place] Flinchum, Eula Fried, FNP   1 year ago Diverticulitis   [facility] Malva Limes, MD   2 years ago Hyperglycemia   [facility] Malva Limes, MD   2 years ago Medicare annual wellness visit, subsequent   [facility] [place], Marzella Schlein, MD   3 years ago Restless leg   [facility] Malva Limes, MD

## 2020-11-19 DIAGNOSIS — G8929 Other chronic pain: Secondary | ICD-10-CM | POA: Diagnosis not present

## 2020-11-19 DIAGNOSIS — M7050 Other bursitis of knee, unspecified knee: Secondary | ICD-10-CM | POA: Diagnosis not present

## 2020-11-19 DIAGNOSIS — M8949 Other hypertrophic osteoarthropathy, multiple sites: Secondary | ICD-10-CM | POA: Diagnosis not present

## 2020-11-19 DIAGNOSIS — M25562 Pain in left knee: Secondary | ICD-10-CM | POA: Diagnosis not present

## 2020-11-27 ENCOUNTER — Other Ambulatory Visit: Payer: Self-pay | Admitting: Family Medicine

## 2020-11-27 DIAGNOSIS — G2581 Restless legs syndrome: Secondary | ICD-10-CM

## 2020-11-27 NOTE — Telephone Encounter (Signed)
Requested medication (s) are due for refill today - yes  Requested medication (s) are on the active medication list -yes  Future visit scheduled -no  Last refill: 09/21/20  Notes to clinic: Request RF non delegated Rx  Requested Prescriptions  Pending Prescriptions Disp Refills   clonazePAM (KLONOPIN) 0.5 MG tablet [Pharmacy Med Name: CLONAZEPAM 0.5MG  TABLETS] 30 tablet     Sig: TAKE 1/2 TO 1 TABLET BY MOUTH EVERY NIGHT AT BEDTIME      Not Delegated - Psychiatry:  Anxiolytics/Hypnotics Failed - 11/27/2020  8:02 AM      Failed - This refill cannot be delegated      Failed - Urine Drug Screen completed in last 360 days      Failed - Valid encounter within last 6 months    Recent Outpatient Visits           11 months ago Upper respiratory tract infection, unspecified type   [employer], Eula Fried, FNP   1 year ago Diverticulitis   [facility] Malva Limes, MD   2 years ago Hyperglycemia   [facility] Malva Limes, MD   2 years ago Medicare annual wellness visit, subsequent   [facility] [place], Marzella Schlein, MD   3 years ago Restless leg   [facility] Malva Limes, MD                    Requested Prescriptions  Pending Prescriptions Disp Refills   clonazePAM (KLONOPIN) 0.5 MG tablet [Pharmacy Med Name: CLONAZEPAM 0.5MG  TABLETS] 30 tablet     Sig: TAKE 1/2 TO 1 TABLET BY MOUTH EVERY NIGHT AT BEDTIME      Not Delegated - Psychiatry:  Anxiolytics/Hypnotics Failed - 11/27/2020  8:02 AM      Failed - This refill cannot be delegated      Failed - Urine Drug Screen completed in last 360 days      Failed - Valid encounter within last 6 months    Recent Outpatient Visits           11 months ago Upper respiratory tract infection, unspecified type   [facility] Flinchum, Eula Fried, FNP   1 year ago Diverticulitis   [facility] Malva Limes, MD   2 years ago Hyperglycemia   [facility] Malva Limes, MD   2 years ago Medicare annual wellness visit, subsequent   [facility] [place], Marzella Schlein, MD   3 years ago Restless leg   [facility] Malva Limes, MD

## 2020-12-17 ENCOUNTER — Other Ambulatory Visit: Payer: Self-pay | Admitting: Family Medicine

## 2020-12-17 DIAGNOSIS — K2100 Gastro-esophageal reflux disease with esophagitis, without bleeding: Secondary | ICD-10-CM

## 2020-12-17 NOTE — Telephone Encounter (Signed)
Requested medications are due for refill today yes  Requested medications are on the active medication list yes  Last refill 09/20/20  Last visit 07/2018  Future visit scheduled no  Notes to clinic Last addressed in visit 07/2018, please assess.

## 2020-12-17 NOTE — Telephone Encounter (Signed)
LOV 12/31/19 Acute with Flinchum LOV 10/07/19 AWE with McKenzie LOV 09/16/19 Acute with Sherrie Mustache

## 2021-01-25 ENCOUNTER — Other Ambulatory Visit: Payer: Self-pay | Admitting: Family Medicine

## 2021-01-25 DIAGNOSIS — K2100 Gastro-esophageal reflux disease with esophagitis, without bleeding: Secondary | ICD-10-CM

## 2021-01-25 NOTE — Telephone Encounter (Signed)
Requested medication (s) are due for refill today: Yes  Requested medication (s) are on the active medication list: Yes  Last refill:  12/22/20  Future visit scheduled: No  Notes to clinic:  Unable to refill per protocol, office visit needed     Requested Prescriptions  Pending Prescriptions Disp Refills   omeprazole (PRILOSEC) 20 MG capsule [Pharmacy Med Name: OMEPRAZOLE 20MG  CAPSULES] 30 capsule 0    Sig: TAKE 1 CAPSULE(20 MG) BY MOUTH DAILY      Gastroenterology: Proton Pump Inhibitors Failed - 01/25/2021  4:14 PM      Failed - Valid encounter within last 12 months    Recent Outpatient Visits           1 year ago Upper respiratory tract infection, unspecified type   [facility] Flinchum, [facility], FNP   1 year ago Diverticulitis   [facility], MD   2 years ago Hyperglycemia   [facility], MD   3 years ago Medicare annual wellness visit, subsequent   [facility] [place], [place], MD   3 years ago Restless leg   [facility], MD

## 2021-01-25 NOTE — Telephone Encounter (Signed)
Patient called, left VM to return the call to the office to schedule an OV for follow up.  ? ?

## 2021-05-07 ENCOUNTER — Ambulatory Visit (INDEPENDENT_AMBULATORY_CARE_PROVIDER_SITE_OTHER): Payer: Medicare Other | Admitting: Family Medicine

## 2021-05-07 ENCOUNTER — Other Ambulatory Visit: Payer: Self-pay

## 2021-05-07 ENCOUNTER — Encounter: Payer: Self-pay | Admitting: Family Medicine

## 2021-05-07 VITALS — BP 120/80 | HR 80 | Temp 97.7°F | Resp 18 | Ht 63.0 in | Wt 233.6 lb

## 2021-05-07 DIAGNOSIS — K2100 Gastro-esophageal reflux disease with esophagitis, without bleeding: Secondary | ICD-10-CM

## 2021-05-07 DIAGNOSIS — M1712 Unilateral primary osteoarthritis, left knee: Secondary | ICD-10-CM

## 2021-05-07 DIAGNOSIS — E785 Hyperlipidemia, unspecified: Secondary | ICD-10-CM | POA: Diagnosis not present

## 2021-05-07 DIAGNOSIS — R739 Hyperglycemia, unspecified: Secondary | ICD-10-CM

## 2021-05-07 DIAGNOSIS — K449 Diaphragmatic hernia without obstruction or gangrene: Secondary | ICD-10-CM | POA: Diagnosis not present

## 2021-05-07 DIAGNOSIS — G2581 Restless legs syndrome: Secondary | ICD-10-CM

## 2021-05-07 MED ORDER — MELOXICAM 7.5 MG PO TABS: 7.5000 mg | ORAL_TABLET | Freq: Every day | ORAL | 2 refills | Status: DC

## 2021-05-07 MED ORDER — FAMOTIDINE 20 MG PO TABS: 20.0000 mg | ORAL_TABLET | Freq: Two times a day (BID) | ORAL | 2 refills | Status: DC

## 2021-05-07 NOTE — Progress Notes (Signed)
Established patient visit   Patient: Chelsea Tyler   DOB: 07/30/1946   75 y.o. Female  MRN: [id number] Visit Date: 05/07/2021  Today's healthcare provider: Mila Merry, MD   Chief Complaint  Patient presents with   Gastroesophageal Reflux   Restless legs   Hyperglycemia   Hyperlipidemia   Subjective    HPI  Her main complaint is aches and pains. Unable to sleep on left side due feeling short of breath and causes pain in her left knee. Left knee is always very sore and tender. Has pain in right hip and shoulder which she thinks is due to having to sleep on that side.    Had been followed by rheumatology, Dr Cardell Peach and had steroid shot in knee in January. Was prescribed Celebrex but starting having twinges in chest so she stopped taking it. Was also prescribed meloxicam for years but has to take it with omeprazole and causes her to get constipated and bloated. Is now only taking OTC tylenol for her aches and pains.    GERD, Follow up:  The patient was last seen for GERD on 07/26/2018.   Changes made since that visit include trying- omeprazole (PRILOSEC) 20 MG capsule; Take 1 capsule (20 mg total) by mouth daily.  She reports fair compliance with treatment. Patient has been out of medications for a few months. She has been taking OTC Famotidine. She is not having side effects. Marland Kitchen   -----------------------------------------------------------------------------------------   Follow up for restless legs:  The patient was last seen for this on 07/26/2018.   Changes made at last visit include none.  She reports good compliance with treatment. She feels that condition is Unchanged. She is not having side effects.   -----------------------------------------------------------------------------------------   Hyperglycemia, Follow-up  Lab Results  Component Value Date   HGBA1C 5.9 (H) 07/26/2018   HGBA1C 6.2 (H) 01/23/2018   GLUCOSE 131 (H) 12/31/2019   GLUCOSE 136 (H)  09/14/2019   GLUCOSE 108 (H) 07/26/2018    Last seen for for this on 07/26/2018.    Management since that visit includes continue healthy diet. Current symptoms include none and have been stable.  Prior visit with dietician: no Current diet: in general, an "unhealthy" diet Current exercise: none  Pertinent Labs:    Component Value Date/Time   CHOL 248 (H) 07/26/2018 0903   TRIG 148 07/26/2018 0903   CHOLHDL 4.3 07/26/2018 0903   CHOLHDL 4.5 08/08/2017 0942   CREATININE 0.62 12/31/2019 1345   CREATININE 0.66 08/08/2017 0942    Wt Readings from Last 3 Encounters:  05/07/21 233 lb 9.6 oz (106 kg)  12/31/19 200 lb (90.7 kg)  09/16/19 211 lb 12.8 oz (96.1 kg)       Lipid/Cholesterol, Follow-up  Last lipid panel Other pertinent labs  Lab Results  Component Value Date   CHOL 248 (H) 07/26/2018   HDL 58 07/26/2018   LDLCALC 160 (H) 07/26/2018   TRIG 148 07/26/2018   CHOLHDL 4.3 07/26/2018   Lab Results  Component Value Date   ALT 33 12/31/2019   AST 31 12/31/2019   PLT 221 12/31/2019   TSH 2.000 07/26/2018     She was last seen for this on 07/26/2018.    Management since that visit includes advising patient to cut back on saturated fats in her diet.  She reports good compliance with treatment. She is not having side effects.   Symptoms: No chest pain No chest pressure/discomfort  No dyspnea No lower  extremity edema  No numbness or tingling of extremity No orthopnea  No palpitations No paroxysmal nocturnal dyspnea  No speech difficulty No syncope   Current diet: in general, an "unhealthy" diet Current exercise: none  The 10-year ASCVD risk score Denman George DC Jr., et al., 2013) is: 12%  ------------------------------------------------------------------------------  -----------------------------------------------------------------------------------------        Medications: Outpatient Medications Prior to Visit  Medication Sig   clonazePAM (KLONOPIN) 0.5 MG  tablet TAKE 1/2 TO 1 TABLET BY MOUTH EVERY NIGHT AT BEDTIME   famotidine (PEPCID) 10 MG tablet Take 10 mg by mouth 2 (two) times daily.   celecoxib (CELEBREX) 100 MG capsule Take 100 mg by mouth 2 (two) times daily. (Patient not taking: Reported on 05/07/2021)   omeprazole (PRILOSEC) 20 MG capsule TAKE 1 CAPSULE(20 MG) BY MOUTH DAILY (Patient not taking: Reported on 05/07/2021)   [DISCONTINUED] chlorpheniramine-HYDROcodone (TUSSIONEX PENNKINETIC ER) 10-8 MG/5ML SUER Take 5 mLs by mouth every 12 (twelve) hours as needed for cough. Will causes drowsiness; NO DRIVING. (Patient not taking: Reported on 05/07/2021)   [DISCONTINUED] ondansetron (ZOFRAN-ODT) 4 MG disintegrating tablet Take 1 tablet (4 mg total) by mouth every 8 (eight) hours as needed. (Patient not taking: Reported on 05/07/2021)   [DISCONTINUED] predniSONE (DELTASONE) 20 MG tablet Take 2 tablets (40 mg total) by mouth daily. (Patient not taking: Reported on 05/07/2021)   No facility-administered medications prior to visit.    Review of Systems  Constitutional:  Negative for appetite change, chills, fatigue and fever.  Respiratory:  Negative for chest tightness and shortness of breath.   Cardiovascular:  Negative for chest pain and palpitations.  Gastrointestinal:  Negative for abdominal pain, nausea and vomiting.  Neurological:  Negative for dizziness and weakness.      Objective    BP 120/80 (BP Location: Right Arm, Patient Position: Sitting, Cuff Size: Large)   Pulse 80   Temp 97.7 F (36.5 C) (Temporal)   Resp 18   Ht 5\' 3"  (1.6 m)   Wt 233 lb 9.6 oz (106 kg)   BMI 41.38 kg/m     Physical Exam  General appearance: Obese female, cooperative and in no acute distress Head: Normocephalic, without obvious abnormality, atraumatic Respiratory: Respirations even and unlabored, normal respiratory rate Extremities: All extremities are intact.  Skin: Skin color, texture, turgor normal. No rashes seen  Psych: Appropriate mood and  affect. Neurologic: Mental status: Alert, oriented to person, place, and time, thought content appropriate.     Assessment & Plan     1. Hyperlipidemia, unspecified hyperlipidemia type  - CBC - Comprehensive metabolic panel - Lipid panel  2. Hyperglycemia  - TSH - Hemoglobin A1c  3. Hiatal hernia   4. Gastroesophageal reflux disease with esophagitis, unspecified whether hemorrhage Was controlled on omeprazole, but was causing constipation and bloating when combined with meloxicam. She states OTC famotidine 10mg  is working well as long as she is very careful with her diet. Will increase to -famotidine (PEPCID) 20 MG tablet; Take 1 tablet (20 mg total) by mouth 2 (two) times daily since we are starting her back on meloxicam as below.   5. Restless leg Very well controlled with qhs clonazepam.   6. Arthritis of left knee Restart meloxicam (MOBIC) 7.5 MG tablet; Take 1 tablet (7.5 mg total) by mouth daily.  Dispense: 30 tablet; Refill: 2. May continue OTC acetaminophen.  Re-evaluate in 2 months.       The entirety of the information documented in the History of Present Illness, Review  of Systems and Physical Exam were personally obtained by me. Portions of this information were initially documented by the CMA and reviewed by me for thoroughness and accuracy.     Lelon Huh, MD  [facility] [phone] (phone) [phone] (fax)  [place]

## 2021-05-08 LAB — LIPID PANEL
Chol/HDL Ratio: 4 ratio (ref 0.0–4.4)
Cholesterol, Total: 221 mg/dL — ABNORMAL HIGH (ref 100–199)
HDL: 55 mg/dL (ref >39)
LDL Chol Calc (NIH): 136 mg/dL — ABNORMAL HIGH (ref 0–99)
Triglycerides: 167 mg/dL — ABNORMAL HIGH (ref 0–149)
VLDL Cholesterol Cal: 30 mg/dL (ref 5–40)

## 2021-05-08 LAB — CBC
Hematocrit: 42.4 % (ref 34.0–46.6)
Hemoglobin: 14.1 g/dL (ref 11.1–15.9)
MCH: 29.3 pg (ref 26.6–33.0)
MCHC: 33.3 g/dL (ref 31.5–35.7)
MCV: 88 fL (ref 79–97)
Platelets: 362 10*3/uL (ref 150–450)
RBC: 4.82 x10E6/uL (ref 3.77–5.28)
RDW: 13.7 % (ref 11.7–15.4)
WBC: 6.6 10*3/uL (ref 3.4–10.8)

## 2021-05-08 LAB — COMPREHENSIVE METABOLIC PANEL
ALT: 30 IU/L (ref 0–32)
AST: 23 IU/L (ref 0–40)
Albumin/Globulin Ratio: 1.9 (ref 1.2–2.2)
Albumin: 4.3 g/dL (ref 3.7–4.7)
Alkaline Phosphatase: 83 IU/L (ref 44–121)
BUN/Creatinine Ratio: 10 — ABNORMAL LOW (ref 12–28)
BUN: 7 mg/dL — ABNORMAL LOW (ref 8–27)
Bilirubin Total: 0.9 mg/dL (ref 0.0–1.2)
CO2: 23 mmol/L (ref 20–29)
Calcium: 9.5 mg/dL (ref 8.7–10.3)
Chloride: 103 mmol/L (ref 96–106)
Creatinine, Ser: 0.73 mg/dL (ref 0.57–1.00)
Globulin, Total: 2.3 g/dL (ref 1.5–4.5)
Glucose: 109 mg/dL — ABNORMAL HIGH (ref 65–99)
Potassium: 5 mmol/L (ref 3.5–5.2)
Sodium: 141 mmol/L (ref 134–144)
Total Protein: 6.6 g/dL (ref 6.0–8.5)
eGFR: 87 mL/min/{1.73_m2} (ref >59)

## 2021-05-08 LAB — HEMOGLOBIN A1C
Est. average glucose Bld gHb Est-mCnc: 134 mg/dL
Hgb A1c MFr Bld: 6.3 % — ABNORMAL HIGH (ref 4.8–5.6)

## 2021-05-08 LAB — TSH: TSH: 2.08 u[IU]/mL (ref 0.450–4.500)

## 2021-06-29 ENCOUNTER — Other Ambulatory Visit: Payer: Self-pay | Admitting: Family Medicine

## 2021-06-29 DIAGNOSIS — G2581 Restless legs syndrome: Secondary | ICD-10-CM

## 2021-06-29 NOTE — Telephone Encounter (Signed)
Requested medications are due for refill today.  yes  Requested medications are on the active medications list.  yes  Last refill. 11/27/2020  Future visit scheduled.   yes  Notes to clinic.  Medication not delegated. 

## 2021-07-07 ENCOUNTER — Ambulatory Visit (INDEPENDENT_AMBULATORY_CARE_PROVIDER_SITE_OTHER): Payer: Medicare Other | Admitting: Family Medicine

## 2021-07-07 ENCOUNTER — Encounter: Payer: Self-pay | Admitting: Family Medicine

## 2021-07-07 ENCOUNTER — Other Ambulatory Visit: Payer: Self-pay

## 2021-07-07 VITALS — BP 137/80 | HR 83 | Temp 97.5°F | Wt 233.0 lb

## 2021-07-07 DIAGNOSIS — K449 Diaphragmatic hernia without obstruction or gangrene: Secondary | ICD-10-CM | POA: Diagnosis not present

## 2021-07-07 DIAGNOSIS — Z1231 Encounter for screening mammogram for malignant neoplasm of breast: Secondary | ICD-10-CM

## 2021-07-07 DIAGNOSIS — M1712 Unilateral primary osteoarthritis, left knee: Secondary | ICD-10-CM

## 2021-07-07 DIAGNOSIS — K2100 Gastro-esophageal reflux disease with esophagitis, without bleeding: Secondary | ICD-10-CM | POA: Diagnosis not present

## 2021-07-07 DIAGNOSIS — E2839 Other primary ovarian failure: Secondary | ICD-10-CM

## 2021-07-07 MED ORDER — FAMOTIDINE 20 MG PO TABS: 20.0000 mg | ORAL_TABLET | Freq: Two times a day (BID) | ORAL | 3 refills | Status: DC

## 2021-07-07 MED ORDER — CELECOXIB 100 MG PO CAPS: 100.0000 mg | ORAL_CAPSULE | Freq: Two times a day (BID) | ORAL | 1 refills | Status: DC

## 2021-07-07 NOTE — Patient Instructions (Signed)
Please call the [facility] at [facility] at [phone] to schedule your mammogram.  

## 2021-07-07 NOTE — Progress Notes (Signed)
Established patient visit   Patient: Chelsea Tyler   DOB: June 05, 1946   75 y.o. Female  MRN: [id number] Visit Date: 07/07/2021  Today's healthcare provider: Mila Merry, MD   Chief Complaint  Patient presents with   Gastroesophageal Reflux   Knee Pain   Subjective  -------------------------------------------------------------------------------------------------------------------- HPI  GERD, Follow up:  The patient was last seen for GERD 2 months ago. Changes made since that visit include increasing famotidine (PEPCID) to 20 MG tablet; Take 1 tablet (20 mg total) by mouth 2 (two) times daily .  She reports excellent compliance with treatment.  She is not having side effects. .  She IS experiencing bilious reflux, heartburn, and need to clear throat frequently.  Pt states famotidine is not working as well as omeprazole but is helping somewhat.  She is NOT experiencing choking on food, nausea, or upper abdominal discomfort  -----------------------------------------------------------------------------------------   Follow up for arthritis of left knee:  The patient was last seen for this 2 months ago. Changes made at last visit include restarting Meloxicam 7.5mg  daily. May continue OTC acetaminophen.  She reports excellent compliance with treatment. She feels that condition is Improved, but still painful most days.  She is not having side effects.   -----------------------------------------------------------------------------------------      Medications: Outpatient Medications Prior to Visit  Medication Sig   acetaminophen (TYLENOL) 500 MG tablet Take 500 mg by mouth every 4 (four) hours as needed.   clonazePAM (KLONOPIN) 0.5 MG tablet TAKE 1/2 TO 1 TABLET BY MOUTH EVERY NIGHT AT BEDTIME   famotidine (PEPCID) 20 MG tablet Take 1 tablet (20 mg total) by mouth 2 (two) times daily.   meloxicam (MOBIC) 7.5 MG tablet Take 1 tablet (7.5 mg total) by mouth daily.    No facility-administered medications prior to visit.    Review of Systems  Constitutional: Negative.   Respiratory:  Positive for cough. Negative for apnea, choking, chest tightness, shortness of breath, wheezing and stridor.   Gastrointestinal: Negative.   Musculoskeletal:  Positive for arthralgias and joint swelling.  Neurological:  Negative for dizziness, light-headedness and headaches.      Objective  -------------------------------------------------------------------------------------------------------------------- BP 137/80 (BP Location: Right Arm, Patient Position: Sitting, Cuff Size: Large)   Pulse 83   Temp (!) 97.5 F (36.4 C) (Oral)   Wt 233 lb (105.7 kg)   SpO2 96%   BMI 41.27 kg/m     Physical Exam  General appearance: Mildly obese female, cooperative and in no acute distress Head: Normocephalic, without obvious abnormality, atraumatic Respiratory: Respirations even and unlabored, normal respiratory rate Extremities: All extremities are intact.  Skin: Skin color, texture, turgor normal. No rashes seen  Psych: Appropriate mood and affect. Neurologic: Mental status: Alert, oriented to person, place, and time, thought content appropriate.     Assessment & Plan  ---------------------------------------------------------------------------------------------------------------------- 1. Arthritis of left knee Somewhat improved on meloxicam. She would is interested in changing to Celebrex which may be less likely to aggravate GERD.  - celecoxib (CELEBREX) 100 MG capsule; Take 1 capsule (100 mg total) by mouth 2 (two) times daily.  Dispense: 180 capsule; Refill: 1  2. Gastroesophageal reflux disease with esophagitis, unspecified whether hemorrhage Tolerating famotidine well, refill  famotidine (PEPCID) 20 MG tablet; Take 1 tablet (20 mg total) by mouth 2 (two) times daily.  Dispense: 180 tablet; Refill: 3  3. Hiatal hernia  - famotidine (PEPCID) 20 MG tablet; Take 1  tablet (20 mg total) by mouth 2 (two) times daily.  Dispense: 180 tablet; Refill: 3  4. Breast cancer screening by mammogram  - MM 3D Screening Breast Bilateral - [facility]; Future  5. Estrogen deficiency  - DG Bone density [place]; Future  Future Appointments  Date Time Provider Department Center  11/01/2021  2:00 PM Sherrie Mustache Demetrios Isaacs, MD [facility]        The entirety of the information documented in the History of Present Illness, Review of Systems and Physical Exam were personally obtained by me. Portions of this information were initially documented by the CMA and reviewed by me for thoroughness and accuracy.     Mila Merry, MD  [facility] [phone] (phone) [phone] (fax)  [facility]

## 2021-07-09 ENCOUNTER — Ambulatory Visit: Payer: Self-pay | Admitting: Family Medicine

## 2021-08-03 ENCOUNTER — Other Ambulatory Visit: Payer: Self-pay | Admitting: Family Medicine

## 2021-08-03 DIAGNOSIS — G2581 Restless legs syndrome: Secondary | ICD-10-CM

## 2021-08-04 ENCOUNTER — Ambulatory Visit
Admission: RE | Admit: 2021-08-04 | Discharge: 2021-08-04 | Disposition: A | Discharge status: Home / Self Care | Payer: Medicare Other | Source: Ambulatory Visit | Attending: Family Medicine | Admitting: Family Medicine

## 2021-08-04 ENCOUNTER — Other Ambulatory Visit: Payer: Self-pay

## 2021-08-04 DIAGNOSIS — E2839 Other primary ovarian failure: Secondary | ICD-10-CM

## 2021-08-04 DIAGNOSIS — Z780 Asymptomatic menopausal state: Secondary | ICD-10-CM | POA: Diagnosis not present

## 2021-08-04 DIAGNOSIS — M85852 Other specified disorders of bone density and structure, left thigh: Secondary | ICD-10-CM | POA: Diagnosis not present

## 2021-08-04 DIAGNOSIS — Z1231 Encounter for screening mammogram for malignant neoplasm of breast: Secondary | ICD-10-CM

## 2021-08-10 ENCOUNTER — Other Ambulatory Visit: Payer: Self-pay | Admitting: Family Medicine

## 2021-08-10 DIAGNOSIS — R928 Other abnormal and inconclusive findings on diagnostic imaging of breast: Secondary | ICD-10-CM

## 2021-08-10 DIAGNOSIS — R921 Mammographic calcification found on diagnostic imaging of breast: Secondary | ICD-10-CM

## 2021-08-13 ENCOUNTER — Ambulatory Visit
Admission: RE | Admit: 2021-08-13 | Discharge: 2021-08-13 | Disposition: A | Discharge status: Home / Self Care | Payer: Medicare Other | Source: Ambulatory Visit | Attending: Family Medicine | Admitting: Family Medicine

## 2021-08-13 ENCOUNTER — Other Ambulatory Visit: Payer: Self-pay

## 2021-08-13 DIAGNOSIS — R922 Inconclusive mammogram: Secondary | ICD-10-CM | POA: Diagnosis not present

## 2021-08-13 DIAGNOSIS — R921 Mammographic calcification found on diagnostic imaging of breast: Secondary | ICD-10-CM | POA: Diagnosis not present

## 2021-08-13 DIAGNOSIS — R928 Other abnormal and inconclusive findings on diagnostic imaging of breast: Secondary | ICD-10-CM | POA: Diagnosis not present

## 2021-09-01 ENCOUNTER — Other Ambulatory Visit: Payer: Self-pay | Admitting: Family Medicine

## 2021-09-01 DIAGNOSIS — G2581 Restless legs syndrome: Secondary | ICD-10-CM

## 2021-09-02 NOTE — Telephone Encounter (Signed)
Requested medications are due for refill today. yes   Requested medications are on the active medications list.  yes  Last refill. 08/03/2021  Future visit scheduled.   yes  Notes to clinic.  Medication not delegated. 

## 2021-11-01 ENCOUNTER — Other Ambulatory Visit: Payer: Self-pay

## 2021-11-01 ENCOUNTER — Encounter: Payer: Self-pay | Admitting: Family Medicine

## 2021-11-01 ENCOUNTER — Ambulatory Visit (INDEPENDENT_AMBULATORY_CARE_PROVIDER_SITE_OTHER): Payer: Medicare Other | Admitting: Family Medicine

## 2021-11-01 VITALS — BP 127/75 | HR 89 | Temp 98.2°F | Ht 63.0 in | Wt 220.0 lb

## 2021-11-01 DIAGNOSIS — Z0000 Encounter for general adult medical examination without abnormal findings: Secondary | ICD-10-CM

## 2021-11-01 DIAGNOSIS — E669 Obesity, unspecified: Secondary | ICD-10-CM

## 2021-11-01 DIAGNOSIS — E785 Hyperlipidemia, unspecified: Secondary | ICD-10-CM

## 2021-11-01 DIAGNOSIS — R7303 Prediabetes: Secondary | ICD-10-CM | POA: Diagnosis not present

## 2021-11-01 NOTE — Patient Instructions (Addendum)
Please review the attached list of medications and notify my office if there are any errors.   Please bring all of your medications to every appointment so we can make sure that our medication list is the same as yours.   I strongly recommend a Covid bivalent (omicron) booster if you have not yet had one    Please go to the lab draw station in [address] on the second floor of [facility]  when you are fasting for 8 hours. Normal hours are 8:00am to 11:30am and 1:00pm to 4:00pm Monday through Friday

## 2021-11-01 NOTE — Progress Notes (Signed)
Annual Wellness Visit     Patient: Chelsea Tyler, Female    DOB: [birth year], 75 y.o.   MRN: [id number] Visit Date: 11/01/2021  Today's Provider: Mila Merry, MD   Chief Complaint  Patient presents with   Medicare Wellness   Subjective    Chelsea Tyler is a 75 y.o. female who presents today for her Annual Wellness Visit. She reports consuming a general diet. The patient does not participate in regular exercise at present. She generally feels well. She reports sleeping well. She does not have additional problems to discuss today.   HPI  Last colonoscopy: 09/14/2017 Last mammogram: 08/13/2021   Medications: Outpatient Medications Prior to Visit  Medication Sig   acetaminophen (TYLENOL) 500 MG tablet Take 500 mg by mouth every 4 (four) hours as needed.   celecoxib (CELEBREX) 100 MG capsule Take 1 capsule (100 mg total) by mouth 2 (two) times daily.   clonazePAM (KLONOPIN) 0.5 MG tablet TAKE 1/2 TO 1 TABLET BY MOUTH DAILY AS NEEDED   famotidine (PEPCID) 20 MG tablet Take 1 tablet (20 mg total) by mouth 2 (two) times daily.   No facility-administered medications prior to visit.    Allergies  Allergen Reactions   Clarithromycin Other (See Comments)    Other reaction(s): Abdominal pain Other reaction(s): Abdominal pain Other reaction(s): Abdominal pain   Metronidazole Nausea And Vomiting    Patient Care Team: Malva Limes, MD as PCP - General (Family Medicine) Scot Jun, MD (Inactive) (Gastroenterology) Patterson Hammersmith, MD as Referring Physician (Internal Medicine)  Review of Systems  Constitutional: Negative.   HENT: Negative.    Eyes: Negative.   Respiratory: Negative.    Cardiovascular: Negative.   Gastrointestinal: Negative.   Endocrine: Negative.   Genitourinary: Negative.   Musculoskeletal: Negative.   Skin: Negative.   Allergic/Immunologic: Negative.   Neurological: Negative.   Hematological: Negative.   Psychiatric/Behavioral:  Negative.    All other systems reviewed and are negative.      Objective    Vitals: BP 127/75 (BP Location: Right Arm, Patient Position: Sitting, Cuff Size: Large)    Pulse 89    Temp 98.2 F (36.8 C) (Oral)    Ht 5\' 3"  (1.6 m)    Wt 220 lb (99.8 kg)    SpO2 97%    BMI 38.97 kg/m    Physical Exam  General: Appearance:    Obese female in no acute distress  Eyes:    PERRL, conjunctiva/corneas clear, EOM's intact       Lungs:     Clear to auscultation bilaterally, respirations unlabored  Heart:    Normal heart rate. Normal rhythm. No murmurs, rubs, or gallops.    MS:   All extremities are intact.    Neurologic:   Awake, alert, oriented x 3. No apparent focal neurological defect.        Most recent functional status assessment: In your present state of health, do you have any difficulty performing the following activities: 11/01/2021  Hearing? N  Vision? N  Difficulty concentrating or making decisions? N  Walking or climbing stairs? N  Dressing or bathing? N  Doing errands, shopping? -  Some recent data might be hidden   Most recent fall risk assessment: Fall Risk  11/01/2021  Falls in the past year? 0  Number falls in past yr: 0  Injury with Fall? 0  Risk for fall due to : No Fall Risks  Follow up Falls evaluation completed  Most recent depression screenings: PHQ 2/9 Scores 11/01/2021 05/07/2021  PHQ - 2 Score 0 0  PHQ- 9 Score 0 1   Most recent cognitive screening: 6CIT Screen 11/01/2021  What Year? 0 points  What month? 0 points  What time? 0 points  Count back from 20 0 points  Months in reverse 0 points  Repeat phrase 0 points  Total Score 0   Most recent Audit-C alcohol use screening Alcohol Use Disorder Test (AUDIT) 11/01/2021  1. How often do you have a drink containing alcohol? 0  2. How many drinks containing alcohol do you have on a typical day when you are drinking? 0  3. How often do you have six or more drinks on one occasion? 0  AUDIT-C Score 0   Alcohol Brief Interventions/Follow-up -   A score of 3 or more in women, and 4 or more in men indicates increased risk for alcohol abuse, EXCEPT if all of the points are from question 1   No results found for any visits on 11/01/21.  Assessment & Plan     Annual wellness visit done today including the all of the following: Reviewed patient's Family Medical History Reviewed and updated list of patient's medical providers Assessment of cognitive impairment was done Assessed patient's functional ability Established a written schedule for health screening services Health Risk Assessent Completed and Reviewed  Exercise Activities and Dietary recommendations  Goals      DIET - EAT MORE FRUITS AND VEGETABLES     Recommend to increase fruit intake to 2 servings a day.          Immunization History  Administered Date(s) Administered   Fluad Quad(high Dose 65+) 07/30/2019   Influenza, High Dose Seasonal PF 09/04/2014, 08/11/2015, 09/01/2016, 07/25/2017, 07/26/2018, 07/30/2019   Influenza-Unspecified 08/28/2021   PFIZER(Purple Top)SARS-COV-2 Vaccination 04/24/2020, 05/15/2020   Pneumococcal Conjugate-13 07/25/2017   Pneumococcal Polysaccharide-23 08/22/2013   Pneumococcal-Unspecified 08/22/2013   Td 12/17/1999   Zoster, Live 08/06/2012    Health Maintenance  Topic Date Due   Zoster Vaccines- Shingrix (1 of 2) Never done   TETANUS/TDAP  12/16/2009   COVID-19 Vaccine (3 - Booster for Pfizer series) 07/10/2020   MAMMOGRAM  08/05/2023   DEXA SCAN  08/04/2024   COLONOSCOPY (Pts 45-31yrs Insurance coverage will need to be confirmed)  09/15/2027   Pneumonia Vaccine 57+ Years old  Completed   INFLUENZA VACCINE  Completed   Hepatitis C Screening  Completed   HPV VACCINES  Aged Out     Discussed health benefits of physical activity, and encouraged her to engage in regular exercise appropriate for her age and condition.         The entirety of the information documented in the  History of Present Illness, Review of Systems and Physical Exam were personally obtained by me. Portions of this information were initially documented by the CMA and reviewed by me for thoroughness and accuracy.     Mila Merry, MD  [facility] [phone] (phone) [phone] (fax)  [facility]

## 2021-11-02 DIAGNOSIS — R7303 Prediabetes: Secondary | ICD-10-CM | POA: Diagnosis not present

## 2021-11-02 DIAGNOSIS — E785 Hyperlipidemia, unspecified: Secondary | ICD-10-CM | POA: Diagnosis not present

## 2021-11-02 DIAGNOSIS — E669 Obesity, unspecified: Secondary | ICD-10-CM | POA: Insufficient documentation

## 2021-11-03 ENCOUNTER — Encounter: Payer: Self-pay | Admitting: Family Medicine

## 2021-11-03 LAB — COMPREHENSIVE METABOLIC PANEL
ALT: 42 IU/L — ABNORMAL HIGH (ref 0–32)
AST: 35 IU/L (ref 0–40)
Albumin/Globulin Ratio: 2.4 — ABNORMAL HIGH (ref 1.2–2.2)
Albumin: 4.5 g/dL (ref 3.7–4.7)
Alkaline Phosphatase: 83 IU/L (ref 44–121)
BUN/Creatinine Ratio: 11 — ABNORMAL LOW (ref 12–28)
BUN: 8 mg/dL (ref 8–27)
Bilirubin Total: 1.1 mg/dL (ref 0.0–1.2)
CO2: 25 mmol/L (ref 20–29)
Calcium: 9.5 mg/dL (ref 8.7–10.3)
Chloride: 102 mmol/L (ref 96–106)
Creatinine, Ser: 0.73 mg/dL (ref 0.57–1.00)
Globulin, Total: 1.9 g/dL (ref 1.5–4.5)
Glucose: 118 mg/dL — ABNORMAL HIGH (ref 70–99)
Potassium: 4.9 mmol/L (ref 3.5–5.2)
Sodium: 142 mmol/L (ref 134–144)
Total Protein: 6.4 g/dL (ref 6.0–8.5)
eGFR: 87 mL/min/{1.73_m2} (ref >59)

## 2021-11-03 LAB — HEMOGLOBIN A1C
Est. average glucose Bld gHb Est-mCnc: 140 mg/dL
Hgb A1c MFr Bld: 6.5 % — ABNORMAL HIGH (ref 4.8–5.6)

## 2021-11-03 LAB — CBC
Hematocrit: 42.4 % (ref 34.0–46.6)
Hemoglobin: 14 g/dL (ref 11.1–15.9)
MCH: 28.7 pg (ref 26.6–33.0)
MCHC: 33 g/dL (ref 31.5–35.7)
MCV: 87 fL (ref 79–97)
Platelets: 360 10*3/uL (ref 150–450)
RBC: 4.87 x10E6/uL (ref 3.77–5.28)
RDW: 13.5 % (ref 11.7–15.4)
WBC: 5 10*3/uL (ref 3.4–10.8)

## 2021-12-02 IMAGING — MG MM DIGITAL DIAGNOSTIC UNILAT*R* W/ TOMO W/ CAD
4 series · 4 of 8 positions shown · non-contrast
Comparison: Previous exam(s).

CLINICAL DATA: Patient returns after screening for evaluation of
RIGHT breast calcifications.

EXAM:
DIGITAL DIAGNOSTIC UNILATERAL RIGHT MAMMOGRAM WITH TOMOSYNTHESIS AND
CAD
TECHNIQUE: Right digital diagnostic mammography and breast tomosynthesis was
performed. The images were evaluated with computer-aided detection.

[R LM]
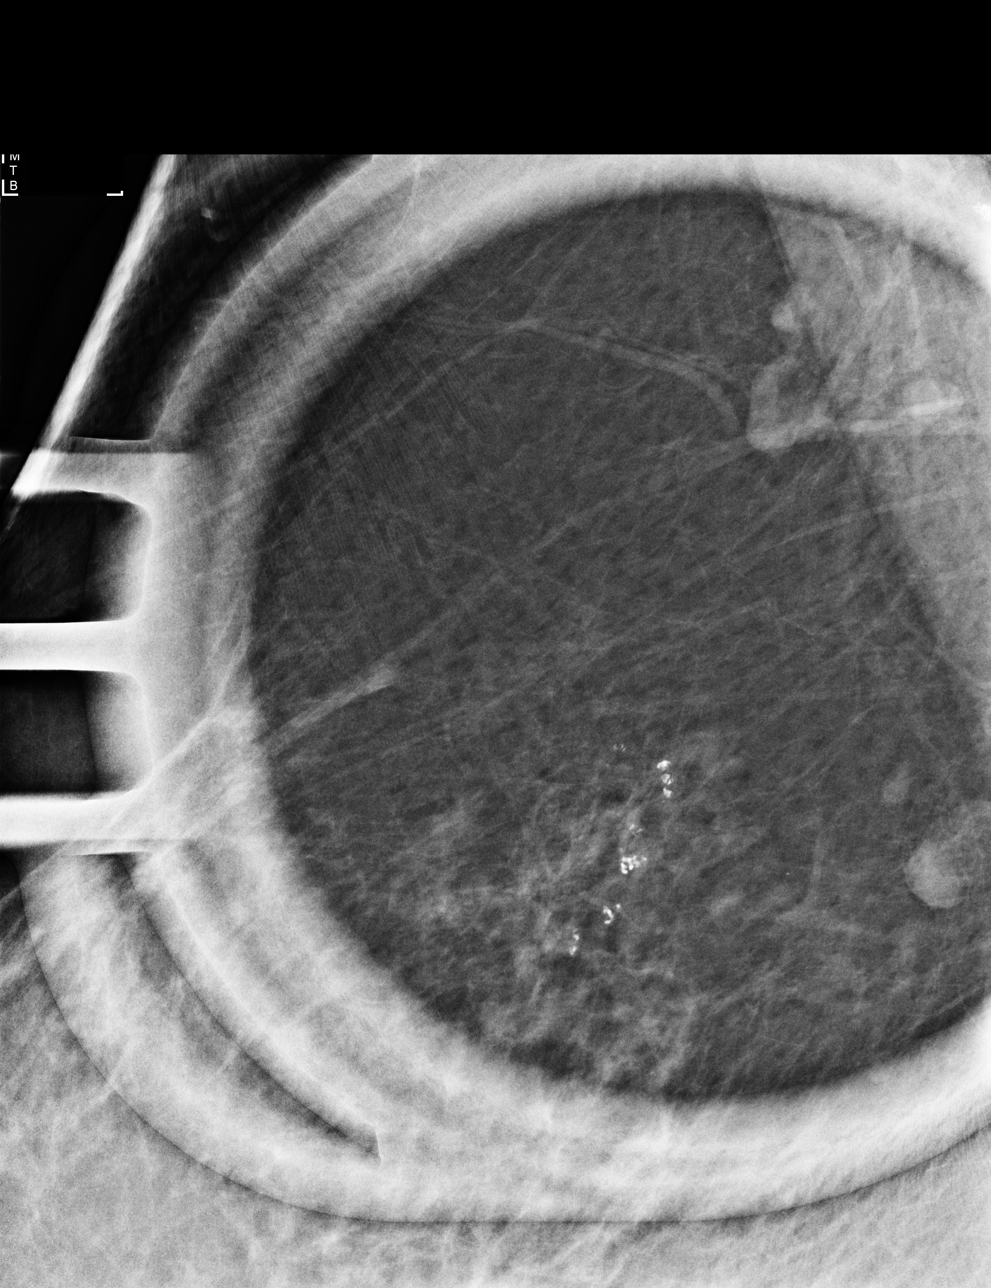

[R CC]
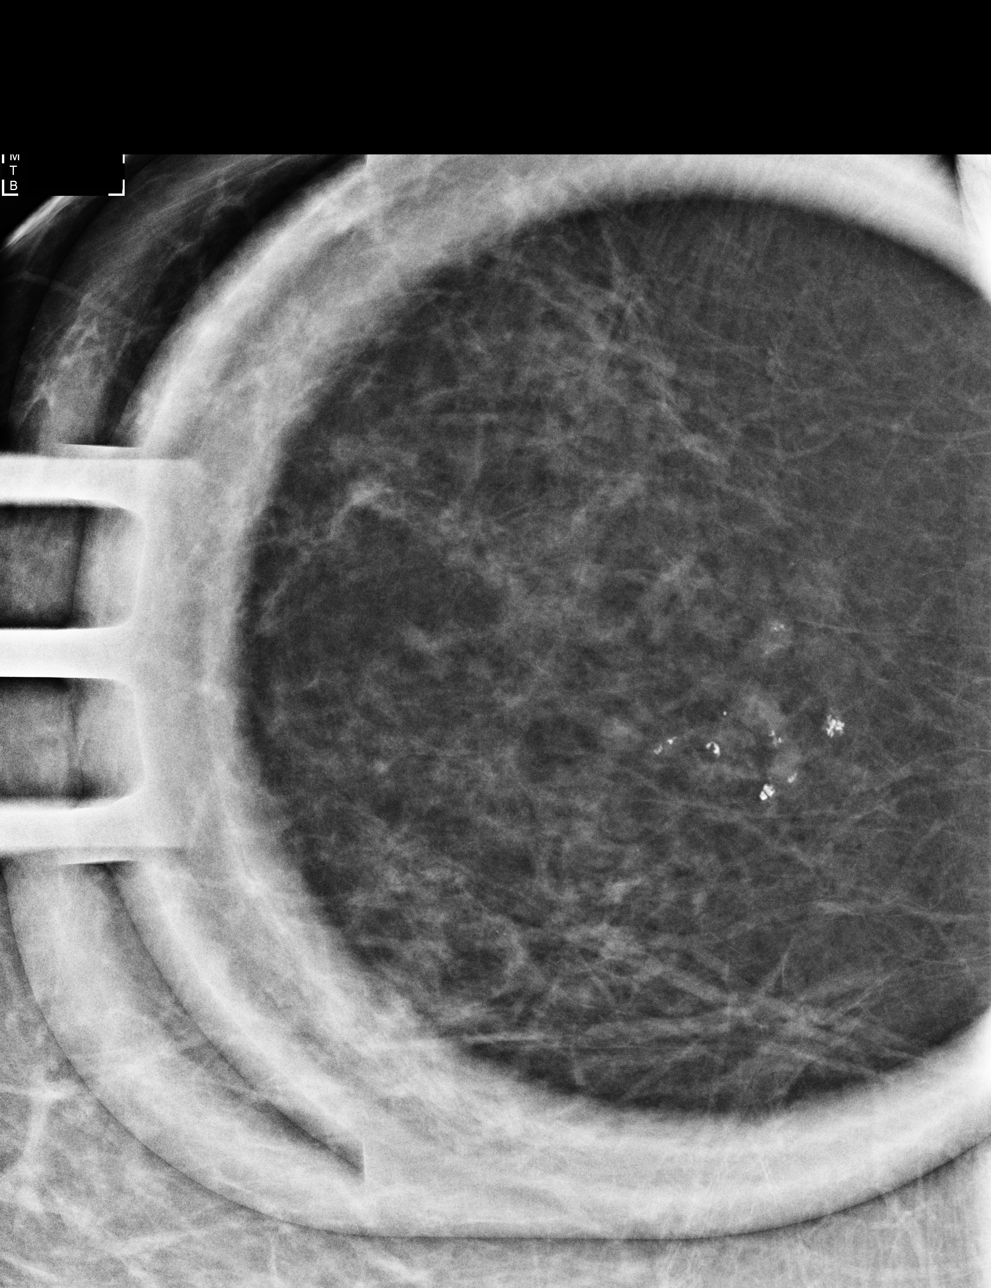

[R ML synth-2D]
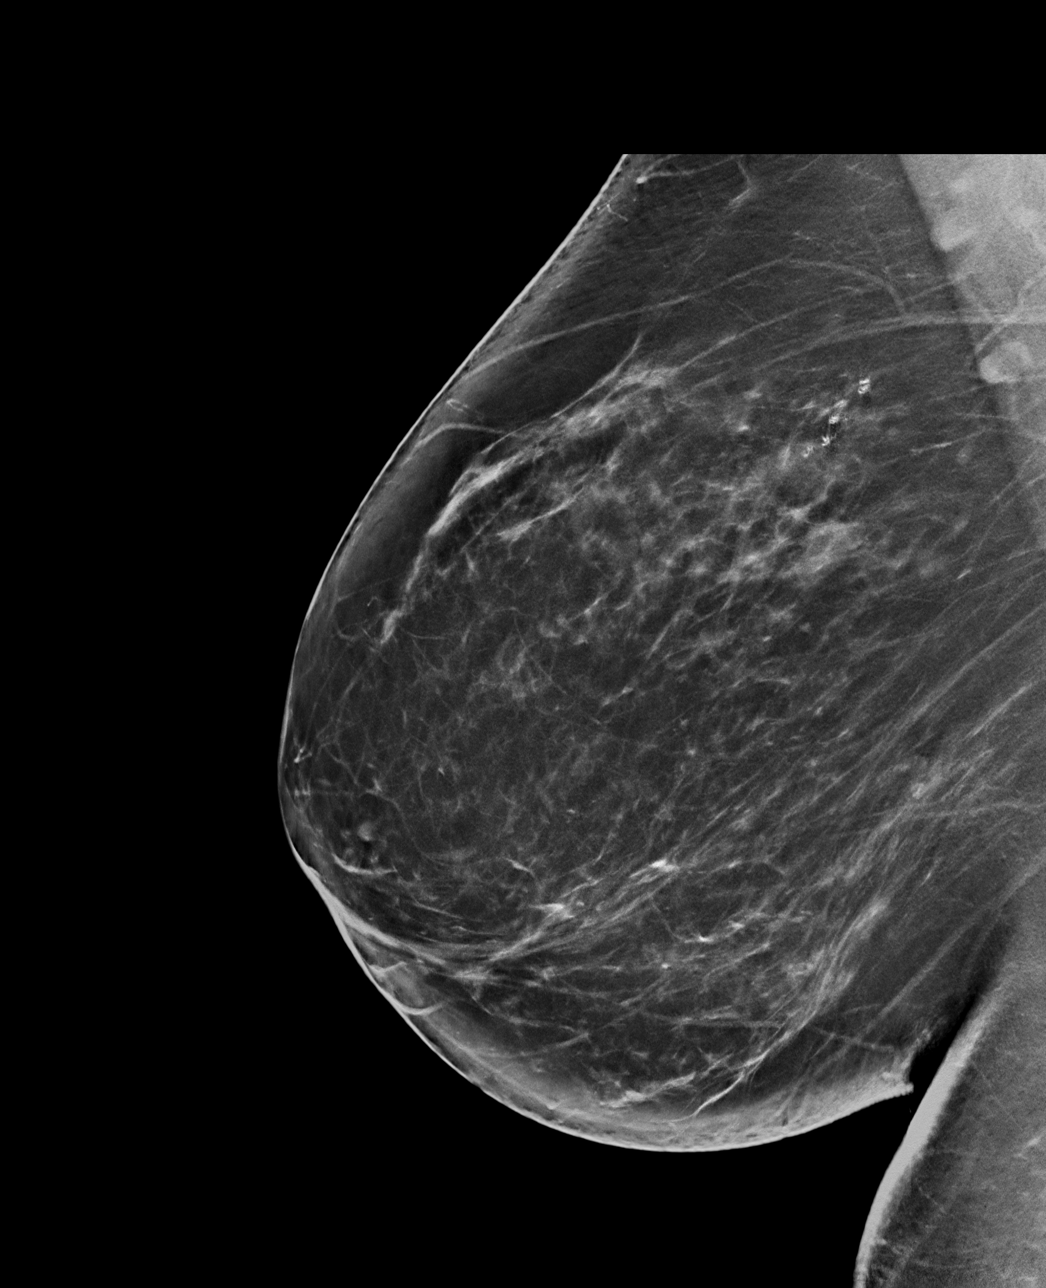

[R ML tomo · tomo slice 43/85.0]
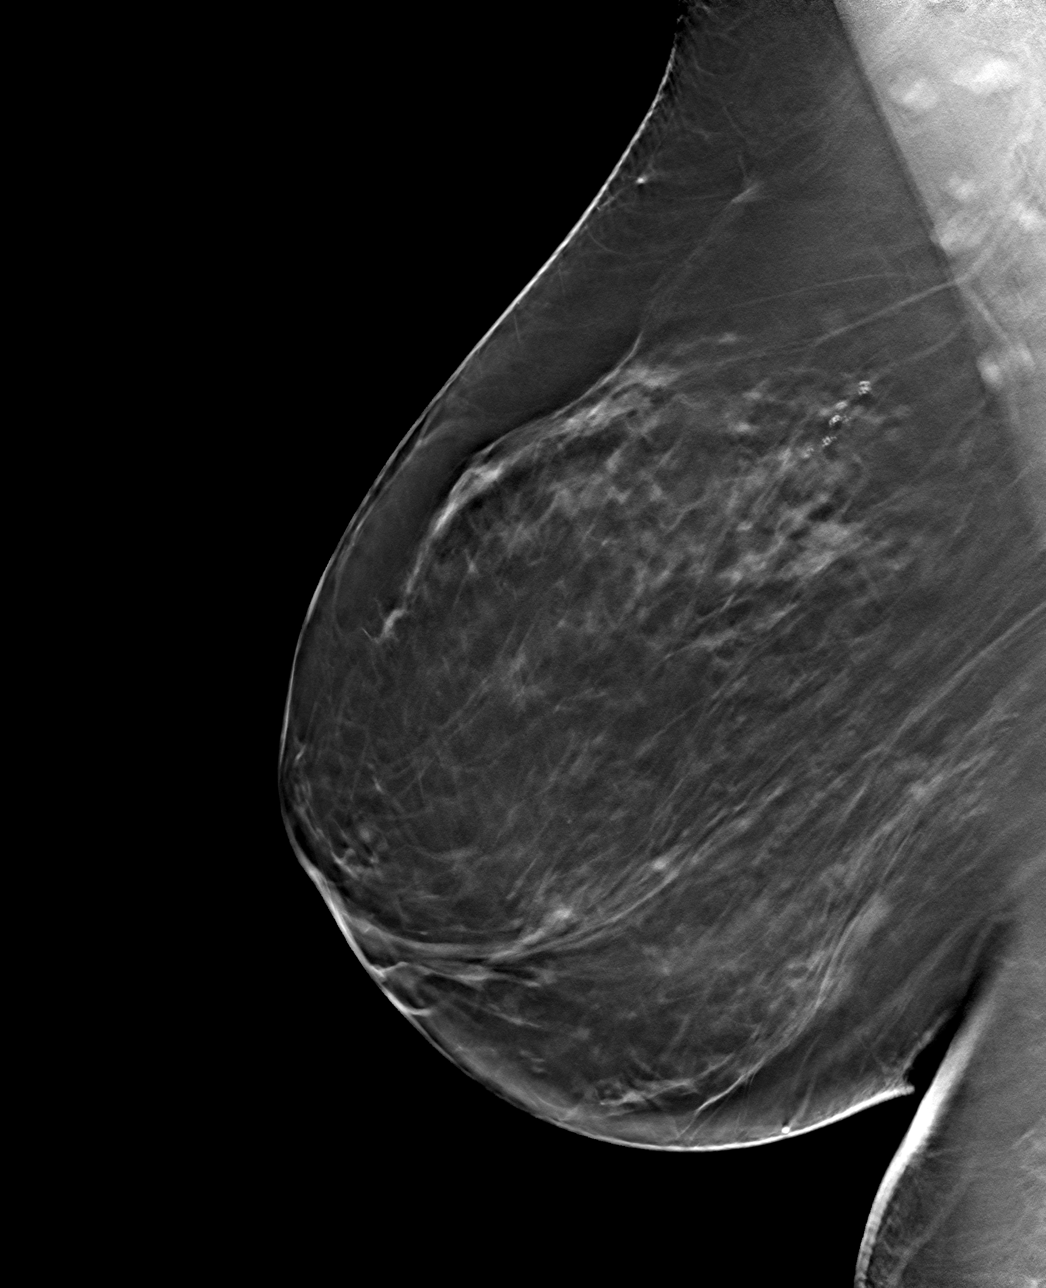

[4 of 8 positions shown; findings below may reference images not displayed]

ACR Breast Density Category b: There are scattered areas of
fibroglandular density.
FINDINGS: Magnified views are performed of calcifications in the UPPER-OUTER
QUADRANT of the RIGHT breast. On magnified views there is a group of
coarse dystrophic calcifications spanning 2.2 centimeters.
Calcifications have no suspicious morphology or distribution.
IMPRESSION: Benign-appearing RIGHT breast calcifications. We discussed
management options including excision, biopsy, and close follow-up.
Imaging followup is recommended at 6, 12, and 24 months to assess
stability. The patient concurs with this plan.

RECOMMENDATION:
Recommend RIGHT diagnostic mammogram in 6 months.

I have discussed the findings and recommendations with the patient.
If applicable, a reminder letter will be sent to the patient
regarding the next appointment.

BI-RADS CATEGORY  3: Probably benign.

## 2022-02-21 ENCOUNTER — Other Ambulatory Visit: Payer: Self-pay | Admitting: Family Medicine

## 2022-02-21 DIAGNOSIS — M1712 Unilateral primary osteoarthritis, left knee: Secondary | ICD-10-CM

## 2022-02-21 DIAGNOSIS — G2581 Restless legs syndrome: Secondary | ICD-10-CM

## 2022-06-17 ENCOUNTER — Encounter: Payer: Self-pay | Admitting: Family Medicine

## 2022-06-17 MED ORDER — OMEPRAZOLE 20 MG PO CPDR: 20.0000 mg | DELAYED_RELEASE_CAPSULE | Freq: Every day | ORAL | 5 refills | Status: DC

## 2022-06-17 MED ORDER — OMEPRAZOLE 20 MG PO CPDR: 20.0000 mg | DELAYED_RELEASE_CAPSULE | Freq: Every day | ORAL | 3 refills | Status: DC

## 2022-06-17 NOTE — Addendum Note (Signed)
Addended by: Malva Limes on: 06/17/2022 02:14 PM   Modules accepted: Orders

## 2022-07-24 ENCOUNTER — Other Ambulatory Visit: Payer: Self-pay | Admitting: Family Medicine

## 2022-07-24 DIAGNOSIS — G2581 Restless legs syndrome: Secondary | ICD-10-CM

## 2022-07-25 ENCOUNTER — Encounter: Payer: Self-pay | Admitting: Family Medicine

## 2022-08-11 ENCOUNTER — Other Ambulatory Visit: Payer: Self-pay | Admitting: Family Medicine

## 2022-08-11 DIAGNOSIS — M1712 Unilateral primary osteoarthritis, left knee: Secondary | ICD-10-CM

## 2022-08-31 ENCOUNTER — Other Ambulatory Visit: Payer: Self-pay | Admitting: Family Medicine

## 2022-08-31 DIAGNOSIS — R921 Mammographic calcification found on diagnostic imaging of breast: Secondary | ICD-10-CM

## 2022-09-08 ENCOUNTER — Ambulatory Visit
Admission: RE | Admit: 2022-09-08 | Discharge: 2022-09-08 | Disposition: A | Discharge status: Home / Self Care | Payer: Medicare Other | Source: Ambulatory Visit | Attending: Family Medicine | Admitting: Family Medicine

## 2022-09-08 DIAGNOSIS — R921 Mammographic calcification found on diagnostic imaging of breast: Secondary | ICD-10-CM | POA: Insufficient documentation

## 2022-09-08 DIAGNOSIS — R92323 Mammographic fibroglandular density, bilateral breasts: Secondary | ICD-10-CM | POA: Diagnosis not present

## 2022-09-15 ENCOUNTER — Other Ambulatory Visit: Payer: Self-pay | Admitting: Family Medicine

## 2022-09-15 DIAGNOSIS — K449 Diaphragmatic hernia without obstruction or gangrene: Secondary | ICD-10-CM

## 2022-09-15 DIAGNOSIS — K2100 Gastro-esophageal reflux disease with esophagitis, without bleeding: Secondary | ICD-10-CM

## 2022-09-15 NOTE — Telephone Encounter (Signed)
Requested Prescriptions  Pending Prescriptions Disp Refills   famotidine (PEPCID) 20 MG tablet [Pharmacy Med Name: FAMOTIDINE 20MG  TABLETS] 180 tablet 3    Sig: TAKE 1 TABLET(20 MG) BY MOUTH TWICE DAILY     Gastroenterology:  H2 Antagonists Passed - 09/15/2022  3:37 AM      Passed - Valid encounter within last 12 months    Recent Outpatient Visits           10 months ago Hyperlipidemia, unspecified hyperlipidemia type   [facility] Birdie Sons, MD   1 year ago Arthritis of left knee   [facility] Birdie Sons, MD   1 year ago Hyperlipidemia, unspecified hyperlipidemia type   [facility] Birdie Sons, MD   2 years ago Upper respiratory tract infection, unspecified type   [facility] Flinchum, Kelby Aline, FNP   3 years ago Diverticulitis   [facility] Birdie Sons, MD

## 2022-09-23 ENCOUNTER — Other Ambulatory Visit: Payer: Medicare Other

## 2022-10-23 ENCOUNTER — Other Ambulatory Visit: Payer: Self-pay | Admitting: Family Medicine

## 2022-10-23 DIAGNOSIS — G2581 Restless legs syndrome: Secondary | ICD-10-CM

## 2022-10-24 NOTE — Telephone Encounter (Signed)
Overdue for follow up office visit. No refill until appt scheduled.

## 2022-12-12 ENCOUNTER — Other Ambulatory Visit: Payer: Self-pay | Admitting: Family Medicine

## 2022-12-12 NOTE — Telephone Encounter (Signed)
Requested Prescriptions  Pending Prescriptions Disp Refills   omeprazole (PRILOSEC) 20 MG capsule [Pharmacy Med Name: OMEPRAZOLE 20MG  CAPSULES] 30 capsule 0    Sig: TAKE 1 CAPSULE(20 MG) BY MOUTH DAILY     Gastroenterology: Proton Pump Inhibitors Failed - 12/12/2022  3:37 AM      Failed - Valid encounter within last 12 months    Recent Outpatient Visits           1 year ago Hyperlipidemia, unspecified hyperlipidemia type   [facility] Birdie Sons, MD   1 year ago Arthritis of left knee   [place], Donald E, MD   1 year ago Hyperlipidemia, unspecified hyperlipidemia type   [facility] Birdie Sons, MD   2 years ago Upper respiratory tract infection, unspecified type   [place] Flinchum, Kelby Aline, [place]   3 years ago Diverticulitis   [place], Donald E, MD       Future Appointments             In 3 weeks Fisher, Kirstie Peri, MD [facility], [place]

## 2023-01-02 ENCOUNTER — Encounter: Payer: Self-pay | Admitting: Family Medicine

## 2023-01-02 ENCOUNTER — Ambulatory Visit (INDEPENDENT_AMBULATORY_CARE_PROVIDER_SITE_OTHER): Payer: Medicare Other | Admitting: Family Medicine

## 2023-01-02 VITALS — BP 141/85 | HR 86 | Ht 63.0 in | Wt 228.0 lb

## 2023-01-02 DIAGNOSIS — M170 Bilateral primary osteoarthritis of knee: Secondary | ICD-10-CM | POA: Diagnosis not present

## 2023-01-02 DIAGNOSIS — K449 Diaphragmatic hernia without obstruction or gangrene: Secondary | ICD-10-CM

## 2023-01-02 DIAGNOSIS — K2100 Gastro-esophageal reflux disease with esophagitis, without bleeding: Secondary | ICD-10-CM

## 2023-01-02 DIAGNOSIS — E785 Hyperlipidemia, unspecified: Secondary | ICD-10-CM

## 2023-01-02 DIAGNOSIS — R7303 Prediabetes: Secondary | ICD-10-CM

## 2023-01-02 DIAGNOSIS — G2581 Restless legs syndrome: Secondary | ICD-10-CM | POA: Diagnosis not present

## 2023-01-02 MED ORDER — CLONAZEPAM 0.5 MG PO TABS: ORAL_TABLET | ORAL | 5 refills | Status: DC

## 2023-01-02 MED ORDER — MELOXICAM 7.5 MG PO TABS: 7.5000 mg | ORAL_TABLET | Freq: Every day | ORAL | 0 refills | Status: DC

## 2023-01-02 MED ORDER — FAMOTIDINE 20 MG PO TABS: 20.0000 mg | ORAL_TABLET | Freq: Every day | ORAL | 3 refills | Status: DC

## 2023-01-02 MED ORDER — TRAMADOL HCL 50 MG PO TABS: 50.0000 mg | ORAL_TABLET | Freq: Three times a day (TID) | ORAL | 0 refills | Status: DC | PRN

## 2023-01-02 NOTE — Patient Instructions (Signed)
.   Please review the attached list of medications and notify my office if there are any errors.   . Please bring all of your medications to every appointment so we can make sure that our medication list is the same as yours.   

## 2023-01-02 NOTE — Progress Notes (Signed)
Established patient visit   Patient: Chelsea Tyler   DOB: 1946-03-12   77 y.o. Female  MRN: [id number] Visit Date: 01/02/2023  Today's healthcare provider: Lelon Huh, MD   Chief Complaint  Patient presents with   Prediabetes   Hyperlipidemia   Subjective    Primary complaint today is pain in knees and sciatica on right side of back. Is seeing Rheumatology, Dr. Posey Pronto. Has taken 7.24m meloxicam in the past which she felt worked better than celecoxib. She has also taken tramadol in the past which she found to be helpful.   She is also here to follow up on RLS on clonazepam for extended period of time. She takes 1/2 to 1 tablet at bedtime and is effective. No particularly sleepy or lethargic in the mornings after taking it. No other new complaints today.   Medications: Outpatient Medications Prior to Visit  Medication Sig   acetaminophen (TYLENOL) 500 MG tablet Take 500 mg by mouth every 4 (four) hours as needed.   celecoxib (CELEBREX) 100 MG capsule TAKE 1 CAPSULE(100 MG) BY MOUTH TWICE DAILY   omeprazole (PRILOSEC) 20 MG capsule TAKE 1 CAPSULE(20 MG) BY MOUTH DAILY   [DISCONTINUED] clonazePAM (KLONOPIN) 0.5 MG tablet TAKE 1/2 TO 1 TABLET BY MOUTH DAILY AS NEEDED   No facility-administered medications prior to visit.    Review of Systems     Objective    BP (!) 141/85 (BP Location: Left Arm, Patient Position: Sitting, Cuff Size: Large)   Pulse 86   Ht 5' 3"$  (1.6 m)   Wt 228 lb (103.4 kg)   SpO2 95%   BMI 40.39 kg/m    Physical Exam   General: Appearance:    Obese female in no acute distress  Eyes:    PERRL, conjunctiva/corneas clear, EOM's intact       Lungs:     Clear to auscultation bilaterally, respirations unlabored  Heart:    Normal heart rate. Normal rhythm. No murmurs, rubs, or gallops.    MS:   All extremities are intact.    Neurologic:   Awake, alert, oriented x 3. No apparent focal neurological defect.         Assessment & Plan     1.  Restless leg Well controlled.  Continue current medications.  refill clonazePAM (KLONOPIN) 0.5 MG tablet; TAKE 1/2 TO 1 TABLET BY MOUTH DAILY AS NEEDED  Dispense: 30 tablet; Refill: 5  2. Osteoarthritis of both knees, unspecified osteoarthritis type Change would like to go back on  traMADol (ULTRAM) 50 MG tablet; Take 1 tablet (50 mg total) by mouth every 8 (eight) hours as needed.  Dispense: 15 tablet; Refill: 0 and  meloxicam (MOBIC) 7.5 MG tablet; Take 1 tablet (7.5 mg total) by mouth daily.  Dispense: 30 tablet; Refill: 0  Counseled not to take tramadol and clonazepam within 4 hours of each other. Is already on PPI for GERD  3. Hiatal hernia refill- famotidine (PEPCID) 20 MG tablet; Take 1 tablet (20 mg total) by mouth daily.  Dispense: 90 tablet; Refill: 3  4. Gastroesophageal reflux disease with esophagitis, unspecified whether hemorrhage Continue omeprazole and refill famotidine (PEPCID) 20 MG tablet; Take 1 tablet (20 mg total) by mouth daily.  Dispense: 90 tablet; Refill: 3  5. Hyperlipidemia, unspecified hyperlipidemia type Diet controlled.  - CBC - Comprehensive metabolic panel - TSH - Lipid panel  6. Prediabetes  - Hemoglobin A1c   Return in about 3 months (around 04/02/2023) for Annual  Wellness Visit.         Lelon Huh, MD  [place] [phone] (phone) [phone] (fax)  [place]

## 2023-01-19 DIAGNOSIS — E785 Hyperlipidemia, unspecified: Secondary | ICD-10-CM | POA: Diagnosis not present

## 2023-01-19 DIAGNOSIS — R7303 Prediabetes: Secondary | ICD-10-CM | POA: Diagnosis not present

## 2023-01-20 LAB — TSH: TSH: 2.22 u[IU]/mL (ref 0.450–4.500)

## 2023-01-20 LAB — COMPREHENSIVE METABOLIC PANEL
ALT: 27 IU/L (ref 0–32)
AST: 19 IU/L (ref 0–40)
Albumin/Globulin Ratio: 2.2 (ref 1.2–2.2)
Albumin: 4.2 g/dL (ref 3.8–4.8)
Alkaline Phosphatase: 83 IU/L (ref 44–121)
BUN/Creatinine Ratio: 10 — ABNORMAL LOW (ref 12–28)
BUN: 7 mg/dL — ABNORMAL LOW (ref 8–27)
Bilirubin Total: 1.2 mg/dL (ref 0.0–1.2)
CO2: 24 mmol/L (ref 20–29)
Calcium: 9.5 mg/dL (ref 8.7–10.3)
Chloride: 104 mmol/L (ref 96–106)
Creatinine, Ser: 0.71 mg/dL (ref 0.57–1.00)
Globulin, Total: 1.9 g/dL (ref 1.5–4.5)
Glucose: 104 mg/dL — ABNORMAL HIGH (ref 70–99)
Potassium: 4.8 mmol/L (ref 3.5–5.2)
Sodium: 142 mmol/L (ref 134–144)
Total Protein: 6.1 g/dL (ref 6.0–8.5)
eGFR: 89 mL/min/{1.73_m2} (ref >59)

## 2023-01-20 LAB — CBC
Hematocrit: 40.4 % (ref 34.0–46.6)
Hemoglobin: 13.4 g/dL (ref 11.1–15.9)
MCH: 29.6 pg (ref 26.6–33.0)
MCHC: 33.2 g/dL (ref 31.5–35.7)
MCV: 89 fL (ref 79–97)
Platelets: 341 10*3/uL (ref 150–450)
RBC: 4.52 x10E6/uL (ref 3.77–5.28)
RDW: 13.9 % (ref 11.7–15.4)
WBC: 5.5 10*3/uL (ref 3.4–10.8)

## 2023-01-20 LAB — HEMOGLOBIN A1C
Est. average glucose Bld gHb Est-mCnc: 134 mg/dL
Hgb A1c MFr Bld: 6.3 % — ABNORMAL HIGH (ref 4.8–5.6)

## 2023-01-20 LAB — LIPID PANEL
Chol/HDL Ratio: 3.4 ratio (ref 0.0–4.4)
Cholesterol, Total: 198 mg/dL (ref 100–199)
HDL: 58 mg/dL (ref >39)
LDL Chol Calc (NIH): 119 mg/dL — ABNORMAL HIGH (ref 0–99)
Triglycerides: 120 mg/dL (ref 0–149)
VLDL Cholesterol Cal: 21 mg/dL (ref 5–40)

## 2023-01-23 ENCOUNTER — Other Ambulatory Visit: Payer: Self-pay | Admitting: Family Medicine

## 2023-01-23 DIAGNOSIS — M170 Bilateral primary osteoarthritis of knee: Secondary | ICD-10-CM

## 2023-01-24 NOTE — Telephone Encounter (Signed)
Requested medication (s) are due for refill today - yes  Requested medication (s) are on the active medication list -yes  Future visit scheduled -no  Last refill: 01/02/23 #15  Notes to clinic: non delegated Rx  Requested Prescriptions  Pending Prescriptions Disp Refills   traMADol (ULTRAM) 50 MG tablet [Pharmacy Med Name: TRAMADOL '50MG'$  TABLETS] 15 tablet     Sig: TAKE 1 TABLET(50 MG) BY MOUTH EVERY 8 HOURS AS NEEDED     Not Delegated - Analgesics:  Opioid Agonists Failed - 01/23/2023  9:34 PM      Failed - This refill cannot be delegated      Failed - Urine Drug Screen completed in last 360 days      Passed - Valid encounter within last 3 months    Recent Outpatient Visits           3 weeks ago Restless leg   [place], Donald E, MD   1 year ago Hyperlipidemia, unspecified hyperlipidemia type   [facility] Birdie Sons, MD   1 year ago Arthritis of left knee   [place], Donald E, MD   1 year ago Hyperlipidemia, unspecified hyperlipidemia type   [facility] Birdie Sons, MD   3 years ago Upper respiratory tract infection, unspecified type   [place] Flinchum, Kelby Aline, [place]                 Requested Prescriptions  Pending Prescriptions Disp Refills   traMADol (ULTRAM) 50 MG tablet [Pharmacy Med Name: TRAMADOL '50MG'$  TABLETS] 15 tablet     Sig: TAKE 1 TABLET(50 MG) BY MOUTH EVERY 8 HOURS AS NEEDED     Not Delegated - Analgesics:  Opioid Agonists Failed - 01/23/2023  9:34 PM      Failed - This refill cannot be delegated      Failed - Urine Drug Screen completed in last 360 days      Passed - Valid encounter within last 3 months    Recent Outpatient Visits           3 weeks ago Restless leg   [place] Birdie Sons, MD   1 year ago Hyperlipidemia, unspecified  hyperlipidemia type   [facility] Birdie Sons, MD   1 year ago Arthritis of left knee   [place], Donald E, MD   1 year ago Hyperlipidemia, unspecified hyperlipidemia type   [facility] Birdie Sons, MD   3 years ago Upper respiratory tract infection, unspecified type   [place] Flinchum, Kelby Aline, FNP

## 2023-01-31 ENCOUNTER — Telehealth: Payer: Self-pay | Admitting: Family Medicine

## 2023-01-31 NOTE — Telephone Encounter (Signed)
Contacted Chelsea Tyler to schedule their annual wellness visit. Patient declined to schedule AWV at this time. Patient will call back to schedule   [place] Direct Dial: [phone]

## 2023-02-03 NOTE — Progress Notes (Unsigned)
I,Sha'taria Tyson,acting as a Education administrator for [employer], PA-C.,have documented all relevant documentation on the behalf of Mikey Kirschner, PA-C,as directed by  Mikey Kirschner, PA-C while in the presence of Mikey Kirschner, PA-C.   Established patient visit   Patient: Chelsea Tyler   DOB: 07/31/1946   77 y.o. Female  MRN: [id number] Visit Date: 02/06/2023  Today's healthcare provider: Mikey Kirschner, PA-C   Cc. Right toe pain  Subjective    HPI  Pt reports 6 weeks ago she noticed a color change to the big toenail of her right foot. It eventually turned back to normal but last week she noticed ,redness, discharge, pain. Denies injury. She has an appointment next week w/ a podiatrist.  She has taken tylenol and one tramadol she had left over for pain management.  Medications: Outpatient Medications Prior to Visit  Medication Sig   acetaminophen (TYLENOL) 500 MG tablet Take 500 mg by mouth every 4 (four) hours as needed.   clonazePAM (KLONOPIN) 0.5 MG tablet TAKE 1/2 TO 1 TABLET BY MOUTH DAILY AS NEEDED   famotidine (PEPCID) 20 MG tablet Take 1 tablet (20 mg total) by mouth daily.   meloxicam (MOBIC) 7.5 MG tablet Take 1 tablet (7.5 mg total) by mouth daily.   omeprazole (PRILOSEC) 20 MG capsule TAKE 1 CAPSULE(20 MG) BY MOUTH DAILY   traMADol (ULTRAM) 50 MG tablet TAKE 1 TABLET(50 MG) BY MOUTH EVERY 8 HOURS AS NEEDED   No facility-administered medications prior to visit.    Review of Systems  Constitutional:  Negative for fatigue and fever.  Respiratory:  Negative for cough and shortness of breath.   Cardiovascular:  Negative for chest pain and leg swelling.  Gastrointestinal:  Negative for abdominal pain.  Skin:  Positive for color change.  Neurological:  Negative for dizziness and headaches.      Objective    BP 121/74 (BP Location: Left Arm, Patient Position: Sitting, Cuff Size: Large)   Pulse 83   Wt 224 lb 9.6 oz (101.9 kg)   SpO2 98%   BMI 39.79 kg/m     Physical Exam Vitals reviewed.  Constitutional:      Appearance: She is not ill-appearing.  HENT:     Head: Normocephalic.  Eyes:     Conjunctiva/sclera: Conjunctivae normal.  Cardiovascular:     Rate and Rhythm: Normal rate.  Pulmonary:     Effort: Pulmonary effort is normal. No respiratory distress.  Feet:     Comments: Right great toe with erythema, edema, fluctuance to the right side of the nailbed, with some lifting of the toenail. Some serous/puslike discharge Neurological:     General: No focal deficit present.     Mental Status: She is alert and oriented to person, place, and time.  Psychiatric:        Mood and Affect: Mood normal.        Behavior: Behavior normal.      No results found for any visits on 02/06/23.  Assessment & Plan     Paronychia great toe, right  Rx bactrim, advised epsom salt soaks, keeping the area clean and dry. Recommend wrapping with gauze. Offered earlier f/u with podiatry, pt declined urgent referral  Any worsening of symptoms, redness/pain please f/b in office.    I, Mikey Kirschner, PA-C have reviewed all documentation for this visit. The documentation on  02/06/23 for the exam, diagnosis, procedures, and orders are all accurate and complete.  Mikey Kirschner, PA-C [facility] [phone]  [id number] [place], [place], [address] Office: [phone] Fax: [phone]

## 2023-02-06 ENCOUNTER — Ambulatory Visit (INDEPENDENT_AMBULATORY_CARE_PROVIDER_SITE_OTHER): Payer: Medicare Other | Admitting: Physician Assistant

## 2023-02-06 ENCOUNTER — Encounter: Payer: Self-pay | Admitting: Physician Assistant

## 2023-02-06 VITALS — BP 121/74 | HR 83 | Wt 224.6 lb

## 2023-02-06 DIAGNOSIS — L03031 Cellulitis of right toe: Secondary | ICD-10-CM | POA: Diagnosis not present

## 2023-02-06 MED ORDER — SULFAMETHOXAZOLE-TRIMETHOPRIM 800-160 MG PO TABS: 1.0000 | ORAL_TABLET | Freq: Two times a day (BID) | ORAL | 0 refills | Status: AC

## 2023-02-14 ENCOUNTER — Ambulatory Visit: Payer: Medicare Other | Admitting: Podiatry

## 2023-02-14 ENCOUNTER — Encounter: Payer: Self-pay | Admitting: Podiatry

## 2023-02-14 VITALS — BP 133/87 | HR 99

## 2023-02-14 DIAGNOSIS — L600 Ingrowing nail: Secondary | ICD-10-CM

## 2023-02-14 NOTE — Progress Notes (Signed)
   Chief Complaint  Patient presents with   Nail Problem    "My toenail was infected." N - toenail infected L - hallux rt D - about 2 weeks O - suddenly C - red, drainage, was sore A - none T - went to primary doctor, she gave be RX for Bactrim, soaking in Epsom Salt    HPI: 77 y.o. female presenting today as a new patient for evaluation of redness with swelling to the right hallux nail plate that has been ongoing for about 2 weeks.  Patient noticed redness and swelling to the toenail and she went to her PCP who prescribed Bactrim DS.  She finished the prescription yesterday with some mild/modest improvement.  Presenting for further treatment evaluation  Past Medical History:  Diagnosis Date   Diverticulitis    History of COVID-19 12/31/2019   Hyperlipidemia     Past Surgical History:  Procedure Laterality Date   ABDOMINAL HYSTERECTOMY     APPENDECTOMY     HYSTEROTOMY  1987   TONSILLECTOMY     TUBAL LIGATION     UPPER GI ENDOSCOPY     10/15/02   UVULOPLASTY      Allergies  Allergen Reactions   Clarithromycin Other (See Comments)    Other reaction(s): Abdominal pain Other reaction(s): Abdominal pain Other reaction(s): Abdominal pain   Metronidazole Nausea And Vomiting     Physical Exam: General: The patient is alert and oriented x3 in no acute distress.  Dermatology: Skin is warm, dry and supple bilateral lower extremities.  The right hallux nail plate is loosely adhered along the medial portion of the nail with some subungual debris and slight purulence.  There is some mild erythema around the base of the nail plate.  Vascular: Palpable pedal pulses bilaterally. Capillary refill within normal limits.  Negative for any significant edema or erythema  Neurological: Light touch and protective threshold grossly intact  Musculoskeletal Exam: No pedal deformities noted   Assessment: 1.  Infected toenail right hallux nail plate  -Patient evaluated -I do believe it  is in the patient's best interest to have the right hallux nail plate completely avulsed and allow for new nail to grow in.  This was discussed with the patient and she agrees.  The toe was prepped in aseptic manner and digital block performed using 3 mL of 2% lidocaine plain. -The nail was avulsed in its entirety followed by dry sterile dressings.  Post care instructions provided -Patient just completed Bactrim DS yesterday.  No refill -Return to clinic as needed      Edrick Kins, DPM [facility]  Dr. Edrick Kins, DPM    [address], [place] [address]                Office [phone]  Fax [phone]

## 2023-02-20 ENCOUNTER — Telehealth: Payer: Self-pay | Admitting: Family Medicine

## 2023-02-20 NOTE — Telephone Encounter (Signed)
Called patient to schedule Medicare Annual Wellness Visit (AWV). Left message for patient to call back and schedule Medicare Annual Wellness Visit (AWV).  Last date of AWV: 11/01/21  Please schedule an appointment at any time with [facility] .  If any questions, please contact me at [phone]  Thank you ,  Randon Goldsmith Care Guide [facility] AWV TEAM Direct Dial: [phone]

## 2023-02-21 ENCOUNTER — Other Ambulatory Visit: Payer: Self-pay | Admitting: Family Medicine

## 2023-02-21 DIAGNOSIS — M170 Bilateral primary osteoarthritis of knee: Secondary | ICD-10-CM

## 2023-04-06 DIAGNOSIS — Z1330 Encounter for screening examination for mental health and behavioral disorders, unspecified: Secondary | ICD-10-CM | POA: Diagnosis not present

## 2023-04-06 DIAGNOSIS — M1712 Unilateral primary osteoarthritis, left knee: Secondary | ICD-10-CM | POA: Diagnosis not present

## 2023-04-06 DIAGNOSIS — G8929 Other chronic pain: Secondary | ICD-10-CM | POA: Diagnosis not present

## 2023-04-06 DIAGNOSIS — M533 Sacrococcygeal disorders, not elsewhere classified: Secondary | ICD-10-CM | POA: Diagnosis not present

## 2023-04-06 DIAGNOSIS — M170 Bilateral primary osteoarthritis of knee: Secondary | ICD-10-CM | POA: Diagnosis not present

## 2023-04-06 DIAGNOSIS — M25552 Pain in left hip: Secondary | ICD-10-CM | POA: Diagnosis not present

## 2023-04-06 DIAGNOSIS — M25561 Pain in right knee: Secondary | ICD-10-CM | POA: Diagnosis not present

## 2023-04-06 DIAGNOSIS — M25562 Pain in left knee: Secondary | ICD-10-CM | POA: Diagnosis not present

## 2023-05-10 ENCOUNTER — Other Ambulatory Visit (HOSPITAL_BASED_OUTPATIENT_CLINIC_OR_DEPARTMENT_OTHER): Payer: Self-pay | Admitting: Medical

## 2023-05-10 ENCOUNTER — Ambulatory Visit (HOSPITAL_BASED_OUTPATIENT_CLINIC_OR_DEPARTMENT_OTHER)
Admission: RE | Admit: 2023-05-10 | Discharge: 2023-05-10 | Disposition: A | Discharge status: Home / Self Care | Payer: Medicare Other | Source: Ambulatory Visit | Attending: Medical | Admitting: Medical

## 2023-05-10 DIAGNOSIS — M25461 Effusion, right knee: Secondary | ICD-10-CM

## 2023-05-10 DIAGNOSIS — M25561 Pain in right knee: Secondary | ICD-10-CM | POA: Diagnosis not present

## 2023-05-10 DIAGNOSIS — M7989 Other specified soft tissue disorders: Secondary | ICD-10-CM | POA: Diagnosis not present

## 2023-05-10 DIAGNOSIS — M79604 Pain in right leg: Secondary | ICD-10-CM | POA: Diagnosis not present

## 2023-05-24 DIAGNOSIS — G8929 Other chronic pain: Secondary | ICD-10-CM | POA: Diagnosis not present

## 2023-05-24 DIAGNOSIS — Z791 Long term (current) use of non-steroidal anti-inflammatories (NSAID): Secondary | ICD-10-CM | POA: Diagnosis not present

## 2023-05-24 DIAGNOSIS — M159 Polyosteoarthritis, unspecified: Secondary | ICD-10-CM | POA: Diagnosis not present

## 2023-05-24 DIAGNOSIS — M25561 Pain in right knee: Secondary | ICD-10-CM | POA: Diagnosis not present

## 2023-07-13 ENCOUNTER — Other Ambulatory Visit: Payer: Self-pay | Admitting: Family Medicine

## 2023-07-13 DIAGNOSIS — G2581 Restless legs syndrome: Secondary | ICD-10-CM

## 2023-09-05 ENCOUNTER — Other Ambulatory Visit: Payer: Self-pay | Admitting: Family Medicine

## 2023-11-15 ENCOUNTER — Other Ambulatory Visit: Payer: Self-pay | Admitting: Family Medicine

## 2023-11-15 DIAGNOSIS — M1712 Unilateral primary osteoarthritis, left knee: Secondary | ICD-10-CM

## 2023-11-15 DIAGNOSIS — G2581 Restless legs syndrome: Secondary | ICD-10-CM

## 2023-12-01 ENCOUNTER — Other Ambulatory Visit: Payer: Self-pay | Admitting: Family Medicine

## 2023-12-01 NOTE — Telephone Encounter (Signed)
Requested Prescriptions  Pending Prescriptions Disp Refills   omeprazole (PRILOSEC) 20 MG capsule [Pharmacy Med Name: OMEPRAZOLE 20MG  CAPSULES] 90 capsule 0    Sig: TAKE 1 CAPSULE(20 MG) BY MOUTH DAILY     Gastroenterology: Proton Pump Inhibitors Passed - 12/01/2023  9:11 AM      Passed - Valid encounter within last 12 months    Recent Outpatient Visits           9 months ago Paronychia of great toe, right   [facility] [place], Shelly, PA-C   11 months ago Restless leg   [facility] Malva Limes, MD   2 years ago Hyperlipidemia, unspecified hyperlipidemia type   [facility] Malva Limes, MD   2 years ago Arthritis of left knee   [facility] Malva Limes, MD   2 years ago Hyperlipidemia, unspecified hyperlipidemia type   [facility] Malva Limes, MD

## 2023-12-05 ENCOUNTER — Other Ambulatory Visit: Payer: Self-pay | Admitting: Family Medicine

## 2023-12-05 DIAGNOSIS — Z1231 Encounter for screening mammogram for malignant neoplasm of breast: Secondary | ICD-10-CM

## 2023-12-05 DIAGNOSIS — R921 Mammographic calcification found on diagnostic imaging of breast: Secondary | ICD-10-CM

## 2023-12-05 DIAGNOSIS — R928 Other abnormal and inconclusive findings on diagnostic imaging of breast: Secondary | ICD-10-CM

## 2023-12-16 ENCOUNTER — Emergency Department (HOSPITAL_BASED_OUTPATIENT_CLINIC_OR_DEPARTMENT_OTHER)
Admission: EM | Admit: 2023-12-16 | Discharge: 2023-12-16 | Disposition: A | Discharge status: Home / Self Care | Payer: Medicare Other | Attending: Emergency Medicine | Admitting: Emergency Medicine

## 2023-12-16 ENCOUNTER — Other Ambulatory Visit: Payer: Self-pay

## 2023-12-16 ENCOUNTER — Encounter (HOSPITAL_BASED_OUTPATIENT_CLINIC_OR_DEPARTMENT_OTHER): Payer: Self-pay

## 2023-12-16 DIAGNOSIS — W1840XA Slipping, tripping and stumbling without falling, unspecified, initial encounter: Secondary | ICD-10-CM | POA: Insufficient documentation

## 2023-12-16 DIAGNOSIS — S91212A Laceration without foreign body of left great toe with damage to nail, initial encounter: Secondary | ICD-10-CM | POA: Insufficient documentation

## 2023-12-16 DIAGNOSIS — Z23 Encounter for immunization: Secondary | ICD-10-CM | POA: Insufficient documentation

## 2023-12-16 DIAGNOSIS — S91202A Unspecified open wound of left great toe with damage to nail, initial encounter: Secondary | ICD-10-CM | POA: Diagnosis not present

## 2023-12-16 DIAGNOSIS — S91209A Unspecified open wound of unspecified toe(s) with damage to nail, initial encounter: Secondary | ICD-10-CM

## 2023-12-16 DIAGNOSIS — M170 Bilateral primary osteoarthritis of knee: Secondary | ICD-10-CM

## 2023-12-16 DIAGNOSIS — S99922A Unspecified injury of left foot, initial encounter: Secondary | ICD-10-CM | POA: Diagnosis not present

## 2023-12-16 MED ORDER — TRAMADOL HCL 50 MG PO TABS: 50.0000 mg | ORAL_TABLET | Freq: Four times a day (QID) | ORAL | 0 refills | Status: DC | PRN

## 2023-12-16 MED ORDER — BENZOCAINE 20 % MT AERO
INHALATION_SPRAY | Freq: Once | OROMUCOSAL | Status: DC
  Filled 2023-12-16: qty 57

## 2023-12-16 MED ORDER — TETANUS-DIPHTH-ACELL PERTUSSIS 5-2.5-18.5 LF-MCG/0.5 IM SUSY
0.5000 mL | PREFILLED_SYRINGE | Freq: Once | INTRAMUSCULAR | Status: AC
  Administered 2023-12-16: 0.5 mL via INTRAMUSCULAR
  Filled 2023-12-16: qty 0.5

## 2023-12-16 MED ORDER — LIDOCAINE HCL 2 % IJ SOLN
10.0000 mL | Freq: Once | INTRAMUSCULAR | Status: AC
  Administered 2023-12-16: 200 mg via INTRADERMAL
  Filled 2023-12-16: qty 20

## 2023-12-16 MED ORDER — OXYCODONE-ACETAMINOPHEN 5-325 MG PO TABS
1.0000 | ORAL_TABLET | Freq: Once | ORAL | Status: AC
  Administered 2023-12-16: 1 via ORAL
  Filled 2023-12-16: qty 1

## 2023-12-16 NOTE — ED Triage Notes (Signed)
Patient arrives with complaints of left great toe injury. Patient states that her toenail bent backwards when it got caught on a chair.  Rates a pain an 8/10.

## 2023-12-16 NOTE — ED Provider Notes (Signed)
[place] [facility] AT [facility] Provider Note   CSN: [id number] Arrival date & time: 12/16/23  1825     History  Chief Complaint  Patient presents with   Toe Injury    Left foot    Chelsea Tyler is a 78 y.o. female.  The history is provided by the patient. No language interpreter was used.     78 year old female history of arthritis, restless leg syndrome, prediabetes, presenting for evaluation of toenail  injury.  Patient states she accidentally slipped her foot underneath a recliner and her left great toenail got caught on the edge and bent backward.  Incident happened prior to arrival.  She endorsed sharp throbbing pain about the toenail.  She denies any numbness she does not think she broke her toe.  She is unsure her last tetanus status.  Home Medications Prior to Admission medications   Medication Sig Start Date End Date Taking? Authorizing Provider  acetaminophen (TYLENOL) 500 MG tablet Take 500 mg by mouth every 4 (four) hours as needed.    [provider]  celecoxib (CELEBREX) 100 MG capsule TAKE 1 CAPSULE(100 MG) BY MOUTH TWICE DAILY 11/18/23   Malva Limes, MD  clonazePAM (KLONOPIN) 0.5 MG tablet TAKE 1/2 TO 1 TABLET BY MOUTH DAILY AS NEEDED 11/18/23   Malva Limes, MD  famotidine (PEPCID) 20 MG tablet Take 1 tablet (20 mg total) by mouth daily. 01/02/23   Malva Limes, MD  meloxicam (MOBIC) 7.5 MG tablet TAKE 1 TABLET(7.5 MG) BY MOUTH DAILY 03/13/23   Malva Limes, MD  omeprazole (PRILOSEC) 20 MG capsule TAKE 1 CAPSULE(20 MG) BY MOUTH DAILY 12/01/23   Malva Limes, MD  traMADol (ULTRAM) 50 MG tablet TAKE 1 TABLET(50 MG) BY MOUTH EVERY 8 HOURS AS NEEDED 01/25/23   Malva Limes, MD  ranitidine (ZANTAC) 150 MG tablet Take 150 mg by mouth 2 (two) times daily.  09/14/19  [provider]      Allergies    Clarithromycin and Metronidazole    Review of Systems   Review of Systems  Skin:  Positive for wound.     Physical Exam Updated Vital Signs BP (!) 154/95 (BP Location: Left Arm)   Pulse 94   Temp 98.2 F (36.8 C) (Oral)   Resp 20   Ht 5\' 3"  (1.6 m)   Wt 101.9 kg   SpO2 100%   BMI 39.79 kg/m  Physical Exam Vitals and nursing note reviewed.  Constitutional:      General: She is not in acute distress.    Appearance: She is well-developed.  HENT:     Head: Atraumatic.  Eyes:     Conjunctiva/sclera: Conjunctivae normal.  Pulmonary:     Effort: Pulmonary effort is normal.  Musculoskeletal:        General: Signs of injury (Left great toe: Near complete avulsion of the toenail exposing the nail base.  It is not actively bleeding.  No obvious joint deformity and no tenderness to the pad of the toe.) present.     Cervical back: Neck supple.  Skin:    Findings: No rash.  Neurological:     Mental Status: She is alert.  Psychiatric:        Mood and Affect: Mood normal.     ED Results / Procedures / Treatments   Labs (all labs ordered are listed, but only abnormal results are displayed) Labs Reviewed - No data to display  EKG None  Radiology  No results found.  Procedures .Ortho Injury Treatment  Date/Time: 12/16/2023 7:56 PM  Performed by: Fayrene Helper, PA-C Authorized by: Fayrene Helper, PA-C   Consent:    Consent obtained:  Verbal   Consent given by:  Patient   Procedural risks discussed: poor nail growth.   Alternatives discussed:  Alternative treatmentInjury location: toe Location details: left great toe Injury type: soft tissue (toe nail avulsion) Pre-procedure neurovascular assessment: neurovascularly intact Pre-procedure distal perfusion: normal Anesthesia: digital block  Anesthesia: Local anesthesia used: yes Local Anesthetic: lidocaine 2% without epinephrine Anesthetic total: 4 mL  Patient sedated: NoImmobilization: tape Post-procedure neurovascular assessment: post-procedure neurovascularly intact Post-procedure distal perfusion: normal Post-procedure  neurological function: normal Post-procedure range of motion: normal Comments: Removal of left great toenail avulsion using 11 blade, surgical hemostat, and pickup to surgically separate the toenail away from the base.  Nonocclusive dressing and pressure dressing applied       Medications Ordered in ED Medications  lidocaine (XYLOCAINE) 2 % (with pres) injection 200 mg (200 mg Intradermal Given by Other 12/16/23 1952)  Tdap (BOOSTRIX) injection 0.5 mL (0.5 mLs Intramuscular Given 12/16/23 1932)  oxyCODONE-acetaminophen (PERCOCET/ROXICET) 5-325 MG per tablet 1 tablet (1 tablet Oral Given 12/16/23 1931)    ED Course/ Medical Decision Making/ A&P                                 Medical Decision Making Risk Prescription drug management.   BP (!) 154/95 (BP Location: Left Arm)   Pulse 94   Temp 98.2 F (36.8 C) (Oral)   Resp 20   Ht 5\' 3"  (1.6 m)   Wt 101.9 kg   SpO2 100%   BMI 39.79 kg/m   28:69 PM 78 year old female history of arthritis, restless leg syndrome, prediabetes, presenting for evaluation of toenail  injury.  Patient states she accidentally slipped her foot underneath a recliner and her left great toenail got caught on the edge and bent backward.  Incident happened prior to arrival.  She endorsed sharp throbbing pain about the toenail.  She denies any numbness she does not think she broke her toe.  She is unsure her last tetanus status.  Exam notable for a near complete avulsion of the left great toenail.  Low suspicion for toe fracture or dislocation.  Will perform digital nerve block and will remove toenail and apply appropriate dressing.  Patient given opiate pain medication, will update tetanus.  7:58 PM Successful removal of left toenail which was done by me.  Patient tolerates well.  Appropriate wound care instruction provided.  Patient discharged home with opiate pain medication.  Patient report improvement of her symptoms after treatment.  X-ray considered but not  performed as I have low suspicion for bony fracture or dislocation.  Will also provide podiatry outpatient follow-up as necessary        Final Clinical Impression(s) / ED Diagnoses Final diagnoses:  Toenail avulsion, initial encounter    Rx / DC Orders ED Discharge Orders          Ordered    traMADol (ULTRAM) 50 MG tablet  Every 6 hours PRN        12/16/23 2001              Fayrene Helper, PA-C 12/16/23 2002    Anders Simmonds T, DO 12/17/23 2238

## 2023-12-19 ENCOUNTER — Other Ambulatory Visit: Payer: Self-pay | Admitting: Family Medicine

## 2023-12-19 DIAGNOSIS — K2100 Gastro-esophageal reflux disease with esophagitis, without bleeding: Secondary | ICD-10-CM

## 2023-12-19 DIAGNOSIS — K449 Diaphragmatic hernia without obstruction or gangrene: Secondary | ICD-10-CM

## 2023-12-22 ENCOUNTER — Ambulatory Visit: Payer: Medicare Other | Admitting: Podiatry

## 2023-12-22 ENCOUNTER — Encounter: Payer: Self-pay | Admitting: Podiatry

## 2023-12-22 DIAGNOSIS — S91203A Unspecified open wound of unspecified great toe with damage to nail, initial encounter: Secondary | ICD-10-CM

## 2023-12-22 MED ORDER — TRAMADOL HCL 50 MG PO TABS: 50.0000 mg | ORAL_TABLET | Freq: Four times a day (QID) | ORAL | 0 refills | Status: AC | PRN

## 2023-12-22 NOTE — Progress Notes (Signed)
   Chief Complaint  Patient presents with   Nail Problem    This is a follow-up from a Toenail Avulsion that I had done at [facility]. N - toenail avulsion L - hallux left D - Saturday night O - suddenly C - sharp pain then, now it's really sore A - shoes T - Went to [facility], bandage, cortisone cream    HPI: 78 y.o. female presenting today for outpatient follow-up of traumatic avulsion of the left hallux nail plate which happened at home on 12/16/2023.  She went to the [facility] where the nail was avulsed in its entirety and she presents today for follow-up.  Past Medical History:  Diagnosis Date   Diverticulitis    History of COVID-19 12/31/2019   Hyperlipidemia     Past Surgical History:  Procedure Laterality Date   ABDOMINAL HYSTERECTOMY     APPENDECTOMY     HYSTEROTOMY  1987   TONSILLECTOMY     TUBAL LIGATION     UPPER GI ENDOSCOPY     10/15/02   UVULOPLASTY      Allergies  Allergen Reactions   Clarithromycin Other (See Comments)    Other reaction(s): Abdominal pain Other reaction(s): Abdominal pain Other reaction(s): Abdominal pain   Metronidazole  Nausea And Vomiting     Physical Exam: General: The patient is alert and oriented x3 in no acute distress.  Dermatology: Skin is warm, dry and supple bilateral lower extremities.  Traumatic loss of the left hallux nail plate noted with healthy granular wound bed.  No exposed bone.  No concern clinically for infection.  There is no erythema around the area.  No drainage.  Good potential for healing  Vascular: Palpable pedal pulses bilaterally. Capillary refill within normal limits.  No appreciable edema.  No erythema.  Neurological: Grossly intact via light touch  Musculoskeletal Exam: No pedal deformities noted   Assessment/Plan of Care: 1.  Traumatic avulsion of left hallux nail plate.  DOI: 12/16/2023  -Patient evaluated -Left hallux nail bed appears to be healing appropriately.  No  indication of infection -Patient has been applying cortisone cream to the nailbed area.  Discontinue. -Triple antibiotic provided for the patient to apply daily with a Band-Aid -Recommend soaking in warm water and Epsom salt daily as needed -Return to clinic as needed       Thresa EMERSON Sar, DPM [facility]  Dr. Thresa EMERSON Sar, DPM    [address] [place], [place] [address]                Office [phone]  Fax [phone]

## 2024-01-12 ENCOUNTER — Ambulatory Visit
Admission: RE | Admit: 2024-01-12 | Discharge: 2024-01-12 | Disposition: A | Discharge status: Home / Self Care | Payer: Medicare Other | Source: Ambulatory Visit | Attending: Family Medicine | Admitting: Family Medicine

## 2024-01-12 DIAGNOSIS — R928 Other abnormal and inconclusive findings on diagnostic imaging of breast: Secondary | ICD-10-CM | POA: Insufficient documentation

## 2024-01-12 DIAGNOSIS — Z1231 Encounter for screening mammogram for malignant neoplasm of breast: Secondary | ICD-10-CM | POA: Insufficient documentation

## 2024-01-12 DIAGNOSIS — R921 Mammographic calcification found on diagnostic imaging of breast: Secondary | ICD-10-CM | POA: Insufficient documentation

## 2024-01-12 DIAGNOSIS — R92323 Mammographic fibroglandular density, bilateral breasts: Secondary | ICD-10-CM | POA: Diagnosis not present

## 2024-01-15 ENCOUNTER — Encounter: Payer: Self-pay | Admitting: Physician Assistant

## 2024-01-15 ENCOUNTER — Ambulatory Visit: Admitting: Physician Assistant

## 2024-01-15 VITALS — BP 123/83 | HR 101 | Ht 63.0 in | Wt 230.0 lb

## 2024-01-15 DIAGNOSIS — M170 Bilateral primary osteoarthritis of knee: Secondary | ICD-10-CM

## 2024-01-15 DIAGNOSIS — R42 Dizziness and giddiness: Secondary | ICD-10-CM | POA: Diagnosis not present

## 2024-01-15 DIAGNOSIS — I10 Essential (primary) hypertension: Secondary | ICD-10-CM | POA: Diagnosis not present

## 2024-01-15 DIAGNOSIS — G2581 Restless legs syndrome: Secondary | ICD-10-CM

## 2024-01-15 DIAGNOSIS — R030 Elevated blood-pressure reading, without diagnosis of hypertension: Secondary | ICD-10-CM

## 2024-01-15 NOTE — Progress Notes (Unsigned)
 Established patient visit  Patient: Chelsea Tyler   DOB: June 06, 1946   78 y.o. Female  MRN: [id number] Visit Date: 01/15/2024  Today's healthcare provider: Debera Lat, PA-C   Chief Complaint  Patient presents with   Hypertension   Subjective       Discussed the use of AI scribe software for clinical note transcription with the patient, who gave verbal consent to proceed.  History of Present Illness   The patient, with a past medical history of restless legs, arthritis, and acid reflux, presents with a recent episode of high blood pressure. The patient reports feeling lightheaded and experienced a minor nosebleed. The patient's blood pressure readings were 164/100 and 160/99 on two separate occasions. The patient also reports feeling her heart beating, but denies any chest pain or shortness of breath. The patient has been under significant stress due to her husband's health issues, including heart problems and cancer. The patient also reports a history of a minor respiratory infection about three weeks ago, which has since resolved. The patient has been managing her restless legs with clonazepam, but reports that her legs become fidgety if she tries to nap without the medication. The patient also reports some dizziness when changing positions or turning her head.        01/15/2024    2:43 PM 02/06/2023    9:00 AM 11/01/2021    2:15 PM  PHQ9 SCORE ONLY  PHQ-9 Total Score 0 0 0        01/15/2024    2:43 PM  GAD 7 : Generalized Anxiety Score  Nervous, Anxious, on Edge 1  Control/stop worrying 1  Worry too much - different things 1  Trouble relaxing 0  Restless 0  Easily annoyed or irritable 0  Afraid - awful might happen 0  Total GAD 7 Score 3  Anxiety Difficulty Not difficult at all        01/15/2024    2:43 PM 02/06/2023    9:00 AM 11/01/2021    2:15 PM  Depression screen PHQ 2/9  Decreased Interest 0 0 0  Down, Depressed, Hopeless 0 0 0  PHQ - 2 Score 0 0 0  Altered  sleeping 0 0 0  Tired, decreased energy 0 0 0  Change in appetite 0 0 0  Feeling bad or failure about yourself  0 0 0  Trouble concentrating 0 0 0  Moving slowly or fidgety/restless 0 0 0  Suicidal thoughts 0 0 0  PHQ-9 Score 0 0 0  Difficult doing work/chores Not difficult at all Not difficult at all Not difficult at all      01/15/2024    2:43 PM  GAD 7 : Generalized Anxiety Score  Nervous, Anxious, on Edge 1  Control/stop worrying 1  Worry too much - different things 1  Trouble relaxing 0  Restless 0  Easily annoyed or irritable 0  Afraid - awful might happen 0  Total GAD 7 Score 3  Anxiety Difficulty Not difficult at all    Medications: Outpatient Medications Prior to Visit  Medication Sig   acetaminophen (TYLENOL) 500 MG tablet Take 500 mg by mouth every 4 (four) hours as needed.   celecoxib (CELEBREX) 100 MG capsule TAKE 1 CAPSULE(100 MG) BY MOUTH TWICE DAILY   clonazePAM (KLONOPIN) 0.5 MG tablet TAKE 1/2 TO 1 TABLET BY MOUTH DAILY AS NEEDED   famotidine (PEPCID) 20 MG tablet TAKE 1 TABLET(20 MG) BY MOUTH DAILY   meloxicam (MOBIC) 7.5 MG tablet TAKE  1 TABLET(7.5 MG) BY MOUTH DAILY   omeprazole (PRILOSEC) 20 MG capsule TAKE 1 CAPSULE(20 MG) BY MOUTH DAILY   traMADol (ULTRAM) 50 MG tablet Take 1 tablet (50 mg total) by mouth every 6 (six) hours as needed.   No facility-administered medications prior to visit.    Review of Systems All negative Except see HPI   {Insert previous labs (optional):23779} {See past labs  Heme  Chem  Endocrine  Serology  Results Review (optional):1}   Objective    BP 123/83 (BP Location: Left Arm, Patient Position: Sitting, Cuff Size: Large)   Pulse (!) 101   Ht 5\' 3"  (1.6 m)   Wt 230 lb (104.3 kg)   SpO2 100%   BMI 40.74 kg/m  {Insert last BP/Wt (optional):23777}{See vitals history (optional):1}   Physical Exam Vitals reviewed.  Constitutional:      General: She is not in acute distress.    Appearance: Normal appearance.  She is well-developed. She is not diaphoretic.  HENT:     Head: Normocephalic and atraumatic.  Eyes:     General: No scleral icterus.    Conjunctiva/sclera: Conjunctivae normal.  Neck:     Thyroid: No thyromegaly.  Cardiovascular:     Rate and Rhythm: Normal rate and regular rhythm.     Pulses: Normal pulses.     Heart sounds: Normal heart sounds. No murmur heard. Pulmonary:     Effort: Pulmonary effort is normal. No respiratory distress.     Breath sounds: Normal breath sounds. No wheezing, rhonchi or rales.  Musculoskeletal:     Cervical back: Neck supple.     Right lower leg: No edema.     Left lower leg: No edema.  Lymphadenopathy:     Cervical: No cervical adenopathy.  Skin:    General: Skin is warm and dry.     Findings: No rash.  Neurological:     Mental Status: She is alert and oriented to person, place, and time. Mental status is at baseline.  Psychiatric:        Mood and Affect: Mood normal.        Behavior: Behavior normal.      No results found for any visits on 01/15/24.      Assessment and Plan    Hypertension Elevated blood pressure readings at home. Stress may contribute. Monitoring required to assess if transient or developing hypertension. - Measure blood pressure twice daily and record. - Bring blood pressure cuff to next appointment for comparison. - Schedule follow-up in 1-2 weeks with Dr. Sherrie Mustache or current provider. - Monitor for chest pain, shortness of breath, or rapid heart beating.  Benign Paroxysmal Positional Vertigo (BPPV) Episodes of dizziness suggest BPPV. Condition is benign. Exercises recommended. - Perform Epley maneuver exercises at home. - Review provided resources on vertigo and exercises. - Consider referral to physical therapy if symptoms persist or worsen.  Restless Legs Syndrome Clonazepam used for restless legs syndrome. Discussed potential long-term issues and need for alternatives if efficacy decreases. - Consider  alternative medications if clonazepam becomes less effective.  Osteoarthritis Osteoarthritis in knees causing soreness. Physical activity emphasized for symptom management. - Engage in daily joint exercises and physical therapy.  General Health Maintenance Advised on healthy lifestyle to support overall health and manage blood pressure. - Eat a healthy diet, avoiding salty and spicy foods. - Stay hydrated by drinking plenty of water. - Engage in stress-reducing activities such as prayer or meditation. - Use nasal saline spray or gel to manage  nasal dryness.      No orders of the defined types were placed in this encounter.   Return in about 1 week (around 01/22/2024) for BP f/u in 1-2 weeks.   The patient was advised to call back or seek an in-person evaluation if the symptoms worsen or if the condition fails to improve as anticipated.  I discussed the assessment and treatment plan with the patient. The patient was provided an opportunity to ask questions and all were answered. The patient agreed with the plan and demonstrated an understanding of the instructions.  I, Debera Lat, PA-C have reviewed all documentation for this visit. The documentation on 01/15/2024  for the exam, diagnosis, procedures, and orders are all accurate and complete.  Debera Lat, [facility], MMS [facility] [phone] (phone) [phone] (fax)  [facility]

## 2024-01-29 ENCOUNTER — Ambulatory Visit (INDEPENDENT_AMBULATORY_CARE_PROVIDER_SITE_OTHER): Admitting: Family Medicine

## 2024-01-29 ENCOUNTER — Ambulatory Visit
Admission: RE | Admit: 2024-01-29 | Discharge: 2024-01-29 | Disposition: A | Discharge status: Home / Self Care | Attending: Family Medicine | Admitting: Family Medicine

## 2024-01-29 ENCOUNTER — Encounter: Payer: Self-pay | Admitting: Family Medicine

## 2024-01-29 ENCOUNTER — Ambulatory Visit
Admission: RE | Admit: 2024-01-29 | Discharge: 2024-01-29 | Disposition: A | Discharge status: Home / Self Care | Source: Ambulatory Visit | Attending: Family Medicine | Admitting: Family Medicine

## 2024-01-29 VITALS — BP 106/74 | HR 90 | Resp 16 | Ht 63.0 in | Wt 228.0 lb

## 2024-01-29 DIAGNOSIS — R030 Elevated blood-pressure reading, without diagnosis of hypertension: Secondary | ICD-10-CM | POA: Diagnosis not present

## 2024-01-29 DIAGNOSIS — R0781 Pleurodynia: Secondary | ICD-10-CM | POA: Insufficient documentation

## 2024-01-29 DIAGNOSIS — R7303 Prediabetes: Secondary | ICD-10-CM | POA: Diagnosis not present

## 2024-01-29 DIAGNOSIS — E785 Hyperlipidemia, unspecified: Secondary | ICD-10-CM

## 2024-01-29 DIAGNOSIS — M170 Bilateral primary osteoarthritis of knee: Secondary | ICD-10-CM

## 2024-01-29 DIAGNOSIS — R0789 Other chest pain: Secondary | ICD-10-CM

## 2024-01-29 DIAGNOSIS — R0989 Other specified symptoms and signs involving the circulatory and respiratory systems: Secondary | ICD-10-CM

## 2024-01-29 MED ORDER — AMLODIPINE BESYLATE 5 MG PO TABS
5.0000 mg | ORAL_TABLET | Freq: Every day | ORAL | 0 refills | Status: DC
Start: 2024-01-29 — End: 2024-03-20

## 2024-01-29 MED ORDER — MELOXICAM 7.5 MG PO TABS: 7.5000 mg | ORAL_TABLET | Freq: Every day | ORAL | 1 refills | Status: AC

## 2024-01-29 NOTE — Patient Instructions (Signed)
 Please review the attached list of medications and notify my office if there are any errors.   Go to [facility]) Imaging at [facility] for your Xrays (phone no. [phone])

## 2024-01-30 ENCOUNTER — Telehealth: Payer: Self-pay | Admitting: Family Medicine

## 2024-01-30 DIAGNOSIS — R7303 Prediabetes: Secondary | ICD-10-CM | POA: Diagnosis not present

## 2024-01-30 DIAGNOSIS — E785 Hyperlipidemia, unspecified: Secondary | ICD-10-CM | POA: Diagnosis not present

## 2024-01-30 NOTE — Telephone Encounter (Signed)
 Filled yesterday

## 2024-01-30 NOTE — Telephone Encounter (Signed)
 [facility] pharmacy is requesting refill amLODipine (NORVASC) 5 MG tablet   Please advise

## 2024-01-31 ENCOUNTER — Encounter: Payer: Self-pay | Admitting: Family Medicine

## 2024-01-31 LAB — CBC
Hematocrit: 41.5 % (ref 34.0–46.6)
Hemoglobin: 13.7 g/dL (ref 11.1–15.9)
MCH: 29.3 pg (ref 26.6–33.0)
MCHC: 33 g/dL (ref 31.5–35.7)
MCV: 89 fL (ref 79–97)
Platelets: 362 10*3/uL (ref 150–450)
RBC: 4.68 x10E6/uL (ref 3.77–5.28)
RDW: 14 % (ref 11.7–15.4)
WBC: 6.2 10*3/uL (ref 3.4–10.8)

## 2024-01-31 LAB — COMPREHENSIVE METABOLIC PANEL
ALT: 25 IU/L (ref 0–32)
AST: 22 IU/L (ref 0–40)
Albumin: 4.1 g/dL (ref 3.8–4.8)
Alkaline Phosphatase: 89 IU/L (ref 44–121)
BUN/Creatinine Ratio: 13 (ref 12–28)
BUN: 9 mg/dL (ref 8–27)
Bilirubin Total: 1.1 mg/dL (ref 0.0–1.2)
CO2: 25 mmol/L (ref 20–29)
Calcium: 9.1 mg/dL (ref 8.7–10.3)
Chloride: 101 mmol/L (ref 96–106)
Creatinine, Ser: 0.69 mg/dL (ref 0.57–1.00)
Globulin, Total: 2.2 g/dL (ref 1.5–4.5)
Glucose: 131 mg/dL — ABNORMAL HIGH (ref 70–99)
Potassium: 4.7 mmol/L (ref 3.5–5.2)
Sodium: 138 mmol/L (ref 134–144)
Total Protein: 6.3 g/dL (ref 6.0–8.5)
eGFR: 90 mL/min/{1.73_m2} (ref >59)

## 2024-01-31 LAB — HEMOGLOBIN A1C
Est. average glucose Bld gHb Est-mCnc: 134 mg/dL
Hgb A1c MFr Bld: 6.3 % — ABNORMAL HIGH (ref 4.8–5.6)

## 2024-01-31 LAB — LIPID PANEL
Chol/HDL Ratio: 3.8 ratio (ref 0.0–4.4)
Cholesterol, Total: 215 mg/dL — ABNORMAL HIGH (ref 100–199)
HDL: 56 mg/dL (ref >39)
LDL Chol Calc (NIH): 132 mg/dL — ABNORMAL HIGH (ref 0–99)
Triglycerides: 150 mg/dL — ABNORMAL HIGH (ref 0–149)
VLDL Cholesterol Cal: 27 mg/dL (ref 5–40)

## 2024-01-31 LAB — TSH: TSH: 1.7 u[IU]/mL (ref 0.450–4.500)

## 2024-02-02 NOTE — Progress Notes (Signed)
 Established patient visit   Patient: Chelsea Tyler   DOB: 28-Apr-1946   78 y.o. Female  MRN: [id number] Visit Date: 01/29/2024  Today's healthcare provider: Mila Merry, MD   Chief Complaint  Patient presents with   Medical Management of Chronic Issues    Follow-up BP   Subjective    Discussed the use of AI scribe software for clinical note transcription with the patient, who gave verbal consent to proceed.  History of Present Illness   Chelsea Tyler is a 78 year old female with hypertension who presents for follow up hyperlipidemia, pre-diabetes, and OA with concerns about elevated blood pressure readings.  She has been experiencing ongoing issues with elevated blood pressure, with readings fluctuating between normal and high levels. A specific instance noted was a diastolic pressure of 104 mmHg and systolic pressure of 121 mmHg. She has been monitoring her blood pressure at home and brought her device to compare readings, which showed a lower reading than the office device.  She describes feeling better over the last few days. Occasional lightheadedness is reported, which she attributes to vertigo, though it has not been significantly bothersome. No chest pain is present, but she experiences shortness of breath during physical activities, which she considers normal for her. No swelling in her hands, feet, or ankles. Her heart rate has been elevated at times, reaching the 90s, although it usually stays in the 70s.  She is currently taking Celebrex for arthritis but feels that Mobic was more effective in managing her symptoms. Due to hip pain, she has been unable to sleep in a bed for the past year, forcing her to sleep in a recliner. She also takes clonazepam.       Medications: Outpatient Medications Prior to Visit  Medication Sig Note   acetaminophen (TYLENOL) 500 MG tablet Take 500 mg by mouth every 4 (four) hours as needed.    clonazePAM (KLONOPIN) 0.5 MG tablet TAKE  1/2 TO 1 TABLET BY MOUTH DAILY AS NEEDED    famotidine (PEPCID) 20 MG tablet TAKE 1 TABLET(20 MG) BY MOUTH DAILY    omeprazole (PRILOSEC) 20 MG capsule TAKE 1 CAPSULE(20 MG) BY MOUTH DAILY    traMADol (ULTRAM) 50 MG tablet Take 1 tablet (50 mg total) by mouth every 6 (six) hours as needed.    celecoxib (CELEBREX) 100 MG capsule TAKE 1 CAPSULE(100 MG) BY MOUTH TWICE DAILY 01/29/2024: pt wants to go back to meloxicam whcih she thinks worked better   No facility-administered medications prior to visit.   Review of Systems  Constitutional:  Negative for appetite change, chills, fatigue and fever.  Respiratory:  Negative for chest tightness and shortness of breath.   Cardiovascular:  Negative for chest pain and palpitations.  Gastrointestinal:  Negative for abdominal pain, nausea and vomiting.  Neurological:  Negative for dizziness and weakness.       Objective    BP 106/74 (BP Location: Left Arm, Patient Position: Sitting, Cuff Size: Large) Comment: Patient's BP cuff  Pulse 90   Resp 16   Ht 5\' 3"  (1.6 m)   Wt 228 lb (103.4 kg)   BMI 40.39 kg/m   Physical Exam   General: Appearance:    Obese female in no acute distress  Eyes:    PERRL, conjunctiva/corneas clear, EOM's intact       Lungs:     Clear to auscultation bilaterally, respirations unlabored  Heart:    Normal heart rate. Normal rhythm. No murmurs,  rubs, or gallops.    MS:   All extremities are intact.    Neurologic:   Awake, alert, oriented x 3. No apparent focal neurological defect.           Assessment & Plan       Hypertension Recent elevated blood pressure with diastolic at 104 mmHg. Variable readings, no chest pain, occasional exertional dyspnea. Heart rate sometimes elevated to 90s. - Perform EKG to assess heart rhythm and rate. - Order blood work to monitor hypertension.  Osteoarthritis Managed with Celebrex, but Mobic was previously more effective. Significant hip discomfort affects sleep. - Prescribe Mobic  for pain management. - Discontinue Celebrex if Mobic is effective.  Prediabetes check A1c, lipids, cbc, met C, and tsh    No follow-ups on file.      Mila Merry, MD  [facility] [phone] (phone) [phone] (fax)  [facility]

## 2024-02-06 ENCOUNTER — Encounter: Payer: Self-pay | Admitting: Family Medicine

## 2024-02-28 ENCOUNTER — Other Ambulatory Visit: Payer: Self-pay | Admitting: Family Medicine

## 2024-03-19 ENCOUNTER — Other Ambulatory Visit: Payer: Self-pay | Admitting: Family Medicine

## 2024-03-19 DIAGNOSIS — G2581 Restless legs syndrome: Secondary | ICD-10-CM

## 2024-03-19 DIAGNOSIS — R0989 Other specified symptoms and signs involving the circulatory and respiratory systems: Secondary | ICD-10-CM

## 2024-04-25 DIAGNOSIS — M25512 Pain in left shoulder: Secondary | ICD-10-CM | POA: Diagnosis not present

## 2024-04-25 DIAGNOSIS — Z791 Long term (current) use of non-steroidal anti-inflammatories (NSAID): Secondary | ICD-10-CM | POA: Diagnosis not present

## 2024-04-25 DIAGNOSIS — M159 Polyosteoarthritis, unspecified: Secondary | ICD-10-CM | POA: Diagnosis not present

## 2024-04-25 DIAGNOSIS — G8929 Other chronic pain: Secondary | ICD-10-CM | POA: Diagnosis not present

## 2024-04-25 DIAGNOSIS — M25511 Pain in right shoulder: Secondary | ICD-10-CM | POA: Diagnosis not present

## 2024-05-20 ENCOUNTER — Other Ambulatory Visit: Payer: Self-pay | Admitting: Family Medicine

## 2024-05-20 DIAGNOSIS — G2581 Restless legs syndrome: Secondary | ICD-10-CM

## 2024-07-23 ENCOUNTER — Ambulatory Visit (INDEPENDENT_AMBULATORY_CARE_PROVIDER_SITE_OTHER): Admitting: Podiatry

## 2024-07-23 ENCOUNTER — Encounter: Payer: Self-pay | Admitting: Podiatry

## 2024-07-23 VITALS — Ht 63.0 in | Wt 228.0 lb

## 2024-07-23 DIAGNOSIS — L600 Ingrowing nail: Secondary | ICD-10-CM | POA: Diagnosis not present

## 2024-07-23 NOTE — Progress Notes (Signed)
   Chief Complaint  Patient presents with   Nail Problem    Pt is here due to left great toenail, she states that the toenail is growing into the skin and is causing some pain.    Subjective: Patient presents today for evaluation of pain to the medial border of the left great toe. Patient is concerned for possible ingrown nail.  It is very sensitive to touch.  Patient presents today for further treatment and evaluation.  Past Medical History:  Diagnosis Date   Diverticulitis    History of COVID-19 12/31/2019   Hyperlipidemia     Past Surgical History:  Procedure Laterality Date   ABDOMINAL HYSTERECTOMY     APPENDECTOMY     HYSTEROTOMY  1987   TONSILLECTOMY     TUBAL LIGATION     UPPER GI ENDOSCOPY     10/15/02   UVULOPLASTY      Allergies  Allergen Reactions   Clarithromycin Other (See Comments)    Other reaction(s): Abdominal pain Other reaction(s): Abdominal pain Other reaction(s): Abdominal pain   Metronidazole  Nausea And Vomiting    Objective:  General: Well developed, nourished, in no acute distress, alert and oriented x3   Dermatology: Skin is warm, dry and supple bilateral.  Medial border left great toe is tender with evidence of an ingrowing nail. Pain on palpation noted to the border of the nail fold. The remaining nails appear unremarkable at this time.   Vascular: DP and PT pulses palpable.  No clinical evidence of vascular compromise  Neruologic: Grossly intact via light touch bilateral.  Musculoskeletal: No pedal deformity noted  Assesement: #1  Ingrowing toenail medial border left great toe  Plan of Care:  -Patient evaluated.  -Discussed treatment alternatives and plan of care. Explained nail avulsion procedure and post procedure course to patient. -Patient opted for temporary partial nail avulsion of the ingrown portion of the nail.  -Prior to procedure, local anesthesia infiltration utilized using 3 ml of a 50:50 mixture of 2% plain lidocaine  and  0.5% plain marcaine in a normal hallux block fashion and a betadine prep performed.  -Partial temporary nail avulsion was performed today to the medial border of the left great toe in the standard fashion.  Dressings applied.  Post care instructions provided. -Return to clinic PRN  Thresa EMERSON Sar, DPM [facility]  Dr. Thresa EMERSON Sar, DPM    [address] [place], [place] [address]                Office [phone]  Fax [phone]

## 2024-07-31 DIAGNOSIS — H524 Presbyopia: Secondary | ICD-10-CM | POA: Diagnosis not present

## 2024-11-17 ENCOUNTER — Other Ambulatory Visit: Payer: Self-pay | Admitting: Family Medicine

## 2024-11-17 DIAGNOSIS — G2581 Restless legs syndrome: Secondary | ICD-10-CM

## 2024-12-13 ENCOUNTER — Other Ambulatory Visit: Payer: Self-pay | Admitting: Family Medicine

## 2024-12-13 DIAGNOSIS — K449 Diaphragmatic hernia without obstruction or gangrene: Secondary | ICD-10-CM

## 2024-12-13 DIAGNOSIS — K2100 Gastro-esophageal reflux disease with esophagitis, without bleeding: Secondary | ICD-10-CM
